# Patient Record
Sex: Male | Born: 1955
Health system: Southern US, Community
[De-identification: ages and names within clinical notes are randomized; demographics above are authoritative.]

## PROBLEM LIST (undated history)

## (undated) DIAGNOSIS — E7800 Pure hypercholesterolemia, unspecified: Secondary | ICD-10-CM

---

## 2002-03-20 ENCOUNTER — Emergency Department (HOSPITAL_COMMUNITY): Admission: EM | Admit: 2002-03-20 | Discharge: 2002-03-20 | Payer: Self-pay | Admitting: Emergency Medicine

## 2002-03-20 ENCOUNTER — Encounter: Payer: Self-pay | Admitting: Emergency Medicine

## 2002-06-06 ENCOUNTER — Ambulatory Visit (HOSPITAL_COMMUNITY): Admission: RE | Admit: 2002-06-06 | Discharge: 2002-06-06 | Payer: Self-pay | Admitting: Internal Medicine

## 2002-06-06 ENCOUNTER — Encounter: Payer: Self-pay | Admitting: Internal Medicine

## 2005-11-30 ENCOUNTER — Ambulatory Visit: Payer: Self-pay | Admitting: Family Medicine

## 2008-06-22 ENCOUNTER — Emergency Department (HOSPITAL_COMMUNITY): Admission: EM | Admit: 2008-06-22 | Discharge: 2008-06-22 | Payer: Self-pay | Admitting: Emergency Medicine

## 2008-06-25 ENCOUNTER — Emergency Department (HOSPITAL_COMMUNITY): Admission: EM | Admit: 2008-06-25 | Discharge: 2008-06-25 | Payer: Self-pay | Admitting: Family Medicine

## 2009-08-18 ENCOUNTER — Emergency Department (HOSPITAL_COMMUNITY): Admission: EM | Admit: 2009-08-18 | Discharge: 2009-08-18 | Payer: Self-pay | Admitting: Emergency Medicine

## 2011-09-05 ENCOUNTER — Ambulatory Visit (INDEPENDENT_AMBULATORY_CARE_PROVIDER_SITE_OTHER): Payer: BC Managed Care – PPO

## 2011-09-05 ENCOUNTER — Inpatient Hospital Stay (INDEPENDENT_AMBULATORY_CARE_PROVIDER_SITE_OTHER)
Admission: RE | Admit: 2011-09-05 | Discharge: 2011-09-05 | Disposition: A | Discharge status: Home / Self Care | Payer: BC Managed Care – PPO | Source: Ambulatory Visit | Attending: Emergency Medicine | Admitting: Emergency Medicine

## 2011-09-05 DIAGNOSIS — K2990 Gastroduodenitis, unspecified, without bleeding: Secondary | ICD-10-CM

## 2011-09-05 DIAGNOSIS — A048 Other specified bacterial intestinal infections: Secondary | ICD-10-CM

## 2011-09-05 DIAGNOSIS — K2970 Gastritis, unspecified, without bleeding: Secondary | ICD-10-CM

## 2011-09-05 LAB — POCT H PYLORI SCREEN: H. PYLORI SCREEN, POC: POSITIVE — AB

## 2011-09-05 LAB — POCT I-STAT, CHEM 8
Chloride: 103 mEq/L (ref 96–112)
HCT: 46 % (ref 39.0–52.0)
Hemoglobin: 15.6 g/dL (ref 13.0–17.0)
Potassium: 3.7 mEq/L (ref 3.5–5.1)
Sodium: 139 mEq/L (ref 135–145)

## 2013-07-23 ENCOUNTER — Encounter (HOSPITAL_COMMUNITY): Payer: Self-pay | Admitting: Emergency Medicine

## 2013-07-23 ENCOUNTER — Emergency Department (HOSPITAL_COMMUNITY)
Admission: EM | Admit: 2013-07-23 | Discharge: 2013-07-23 | Disposition: A | Discharge status: Home / Self Care | Payer: BC Managed Care – PPO | Attending: Emergency Medicine | Admitting: Emergency Medicine

## 2013-07-23 DIAGNOSIS — K2970 Gastritis, unspecified, without bleeding: Secondary | ICD-10-CM | POA: Insufficient documentation

## 2013-07-23 DIAGNOSIS — K2990 Gastroduodenitis, unspecified, without bleeding: Secondary | ICD-10-CM | POA: Insufficient documentation

## 2013-07-23 DIAGNOSIS — F17200 Nicotine dependence, unspecified, uncomplicated: Secondary | ICD-10-CM | POA: Insufficient documentation

## 2013-07-23 DIAGNOSIS — R1013 Epigastric pain: Secondary | ICD-10-CM

## 2013-07-23 LAB — CBC WITH DIFFERENTIAL/PLATELET
Eosinophils Absolute: 0.1 10*3/uL (ref 0.0–0.7)
Hemoglobin: 14.7 g/dL (ref 13.0–17.0)
Lymphocytes Relative: 19 % (ref 12–46)
Lymphs Abs: 1.8 10*3/uL (ref 0.7–4.0)
MCH: 29.5 pg (ref 26.0–34.0)
Monocytes Relative: 6 % (ref 3–12)
Neutro Abs: 7.3 10*3/uL (ref 1.7–7.7)
Neutrophils Relative %: 74 % (ref 43–77)
Platelets: 244 10*3/uL (ref 150–400)
RBC: 4.99 MIL/uL (ref 4.22–5.81)
WBC: 9.8 10*3/uL (ref 4.0–10.5)

## 2013-07-23 LAB — COMPREHENSIVE METABOLIC PANEL
ALT: 18 U/L (ref 0–53)
Alkaline Phosphatase: 83 U/L (ref 39–117)
Chloride: 100 mEq/L (ref 96–112)
GFR calc Af Amer: >90 mL/min (ref >90)
Glucose, Bld: 104 mg/dL — ABNORMAL HIGH (ref 70–99)
Potassium: 3.7 mEq/L (ref 3.5–5.1)
Sodium: 137 mEq/L (ref 135–145)
Total Bilirubin: 0.3 mg/dL (ref 0.3–1.2)
Total Protein: 7.6 g/dL (ref 6.0–8.3)

## 2013-07-23 LAB — URINE MICROSCOPIC-ADD ON

## 2013-07-23 LAB — URINALYSIS, ROUTINE W REFLEX MICROSCOPIC
Bilirubin Urine: NEGATIVE
Glucose, UA: NEGATIVE mg/dL
Protein, ur: NEGATIVE mg/dL
Specific Gravity, Urine: 1.006 (ref 1.005–1.030)

## 2013-07-23 MED ORDER — GI COCKTAIL ~~LOC~~
30.0000 mL | Freq: Once | ORAL | Status: AC
  Administered 2013-07-23: 30 mL via ORAL
  Filled 2013-07-23: qty 30

## 2013-07-23 MED ORDER — FENTANYL CITRATE 0.05 MG/ML IJ SOLN
50.0000 ug | Freq: Once | INTRAMUSCULAR | Status: AC
  Administered 2013-07-23: 50 ug via INTRAVENOUS
  Filled 2013-07-23: qty 2

## 2013-07-23 MED ORDER — PANTOPRAZOLE SODIUM 20 MG PO TBEC: 40.0000 mg | DELAYED_RELEASE_TABLET | Freq: Every day | ORAL | Status: DC

## 2013-07-23 MED ORDER — PANTOPRAZOLE SODIUM 40 MG IV SOLR
40.0000 mg | Freq: Once | INTRAVENOUS | Status: AC
  Administered 2013-07-23: 40 mg via INTRAVENOUS
  Filled 2013-07-23: qty 40

## 2013-07-23 MED ORDER — ONDANSETRON HCL 4 MG/2ML IJ SOLN
4.0000 mg | Freq: Once | INTRAMUSCULAR | Status: AC
  Administered 2013-07-23: 4 mg via INTRAVENOUS
  Filled 2013-07-23: qty 2

## 2013-07-23 MED ORDER — SUCRALFATE 1 G PO TABS: 1.0000 g | ORAL_TABLET | Freq: Four times a day (QID) | ORAL | Status: AC

## 2013-07-23 NOTE — ED Notes (Signed)
Iv started and med given 

## 2013-07-23 NOTE — ED Provider Notes (Signed)
CSN: [id number]     Arrival date & time 07/23/13  0021 History     First MD Initiated Contact with Patient 07/23/13 0105     Chief Complaint  Patient presents with  . Abdominal Pain   (Consider location/radiation/quality/duration/timing/severity/associated sxs/prior Treatment) HPI 57 yo male presents to the ER from home with complaint of upper abdominal pain for the last 3 weeks.  Pain is described as "strong".  No vomiting, no fever.  Pain usually wakes him from sleep around 3-4 am.  He has been taking milk and baking soda with some relief until today.  Pt drinks about a case of beer over two days every weekend.  He has had 4 beers today.  No prior surgeries.  Has not tried OTC meds.    History reviewed. No pertinent past medical history. History reviewed. No pertinent past surgical history. No family history on file. History  Substance Use Topics  . Smoking status: Current Every Day Smoker  . Smokeless tobacco: Not on file  . Alcohol Use: Yes    Review of Systems  All other systems reviewed and are negative.    Allergies  Review of patient's allergies indicates no known allergies.  Home Medications  No current outpatient prescriptions on file. BP 140/105  Pulse 88  Temp(Src) 98.1 F (36.7 C) (Oral)  Resp 20  SpO2 97% Physical Exam  Nursing note and vitals reviewed. Constitutional: He is oriented to person, place, and time. He appears well-developed and well-nourished.  HENT:  Head: Normocephalic and atraumatic.  Nose: Nose normal.  Mouth/Throat: Oropharynx is clear and moist.  Eyes: Conjunctivae and EOM are normal. Pupils are equal, round, and reactive to light.  Neck: Normal range of motion. Neck supple. No JVD present. No tracheal deviation present. No thyromegaly present.  Cardiovascular: Normal rate, regular rhythm, normal heart sounds and intact distal pulses.  Exam reveals no gallop and no friction rub.   No murmur heard. Pulmonary/Chest: Effort normal and  breath sounds normal. No stridor. No respiratory distress. He has no wheezes. He has no rales. He exhibits no tenderness.  Abdominal: Soft. Bowel sounds are normal. He exhibits no distension and no mass. There is tenderness (epigastric tenderness). There is no rebound and no guarding.  Musculoskeletal: Normal range of motion. He exhibits no edema and no tenderness.  Lymphadenopathy:    He has no cervical adenopathy.  Neurological: He is alert and oriented to person, place, and time. A cranial nerve deficit is present. He exhibits normal muscle tone. Coordination normal.  Skin: Skin is dry. No rash noted. No erythema. No pallor.  Psychiatric: He has a normal mood and affect. His behavior is normal. Judgment and thought content normal.    ED Course   Procedures (including critical care time)  Labs Reviewed  COMPREHENSIVE METABOLIC PANEL - Abnormal; Notable for the following:    Glucose, Bld 104 (*)    GFR calc non Af Amer 89 (*)    All other components within normal limits  ETHANOL - Abnormal; Notable for the following:    Alcohol, Ethyl (B) 136 (*)    All other components within normal limits  CBC WITH DIFFERENTIAL  LIPASE, BLOOD  URINALYSIS, ROUTINE W REFLEX MICROSCOPIC   No results found. 1. Epigastric pain   2. Gastritis     MDM  57 yo male with upper abdominal pain.  No signs of pancreatitis based on lipase.  No vomiting.  Possible gastritis/peptic ulcer.  Will start on protonix, carafate.  Pt  instructed to stop drinking, will need PCM and f/u with GI.  Olivia Mackie, MD 07/23/13 [phone]

## 2013-07-23 NOTE — ED Notes (Signed)
The pt reports that his pain  Is gone now

## 2013-07-23 NOTE — ED Notes (Signed)
The pt has had mid abd pain for 3 weeks with diarrhea.  He drank 4 beers today odor of alcohol on his breath.  He denies drinking every day

## 2013-07-23 NOTE — ED Notes (Signed)
PT. REPORTS MID ABDOMINAL PAIN WITH SLIGHT NAUSEA FOR 3 WEEKS , 3 BEERS THIS EVENING , DENIES EMESIS OR DIARRHEA. RESPIRATIONS UNLABORED .

## 2015-03-22 ENCOUNTER — Encounter (HOSPITAL_COMMUNITY): Payer: Self-pay | Admitting: *Deleted

## 2015-03-22 DIAGNOSIS — Z79899 Other long term (current) drug therapy: Secondary | ICD-10-CM | POA: Diagnosis not present

## 2015-03-22 DIAGNOSIS — Z720 Tobacco use: Secondary | ICD-10-CM | POA: Diagnosis not present

## 2015-03-22 DIAGNOSIS — M549 Dorsalgia, unspecified: Secondary | ICD-10-CM | POA: Diagnosis not present

## 2015-03-22 DIAGNOSIS — K2970 Gastritis, unspecified, without bleeding: Secondary | ICD-10-CM | POA: Insufficient documentation

## 2015-03-22 DIAGNOSIS — R1013 Epigastric pain: Secondary | ICD-10-CM | POA: Diagnosis present

## 2015-03-22 NOTE — ED Notes (Signed)
Called pt twice for triage  with no answer  

## 2015-03-22 NOTE — ED Notes (Signed)
abd pain for 2 weeks he vomited today.  He has been drinking  Alcohol for the opast several days

## 2015-03-23 ENCOUNTER — Emergency Department (HOSPITAL_COMMUNITY)
Admission: EM | Admit: 2015-03-23 | Discharge: 2015-03-23 | Disposition: A | Discharge status: Home / Self Care | Payer: BLUE CROSS/BLUE SHIELD | Attending: Emergency Medicine | Admitting: Emergency Medicine

## 2015-03-23 DIAGNOSIS — K2970 Gastritis, unspecified, without bleeding: Secondary | ICD-10-CM

## 2015-03-23 LAB — URINALYSIS, ROUTINE W REFLEX MICROSCOPIC
BILIRUBIN URINE: NEGATIVE
GLUCOSE, UA: NEGATIVE mg/dL
Hgb urine dipstick: NEGATIVE
KETONES UR: NEGATIVE mg/dL
LEUKOCYTES UA: NEGATIVE
NITRITE: NEGATIVE
PH: 5 (ref 5.0–8.0)
PROTEIN: NEGATIVE mg/dL
Specific Gravity, Urine: 1.011 (ref 1.005–1.030)
Urobilinogen, UA: 0.2 mg/dL (ref 0.0–1.0)

## 2015-03-23 LAB — CBC WITH DIFFERENTIAL/PLATELET
BASOS ABS: 0 10*3/uL (ref 0.0–0.1)
BASOS PCT: 0 % (ref 0–1)
EOS PCT: 1 % (ref 0–5)
Eosinophils Absolute: 0.1 10*3/uL (ref 0.0–0.7)
HEMATOCRIT: 43 % (ref 39.0–52.0)
Hemoglobin: 14.5 g/dL (ref 13.0–17.0)
LYMPHS ABS: 2.4 10*3/uL (ref 0.7–4.0)
Lymphocytes Relative: 33 % (ref 12–46)
MCH: 28.1 pg (ref 26.0–34.0)
MCHC: 33.7 g/dL (ref 30.0–36.0)
MCV: 83.3 fL (ref 78.0–100.0)
MONO ABS: 0.5 10*3/uL (ref 0.1–1.0)
Monocytes Relative: 7 % (ref 3–12)
NEUTROS ABS: 4.2 10*3/uL (ref 1.7–7.7)
Neutrophils Relative %: 59 % (ref 43–77)
Platelets: 296 10*3/uL (ref 150–400)
RBC: 5.16 MIL/uL (ref 4.22–5.81)
RDW: 15.4 % (ref 11.5–15.5)
WBC: 7.3 10*3/uL (ref 4.0–10.5)

## 2015-03-23 LAB — COMPREHENSIVE METABOLIC PANEL
ALT: 22 U/L (ref 0–53)
ANION GAP: 5 (ref 5–15)
AST: 33 U/L (ref 0–37)
Albumin: 4.5 g/dL (ref 3.5–5.2)
Alkaline Phosphatase: 79 U/L (ref 39–117)
BILIRUBIN TOTAL: 0.6 mg/dL (ref 0.3–1.2)
BUN: 6 mg/dL (ref 6–23)
CALCIUM: 9.2 mg/dL (ref 8.4–10.5)
CO2: 32 mmol/L (ref 19–32)
Chloride: 105 mmol/L (ref 96–112)
Creatinine, Ser: 0.99 mg/dL (ref 0.50–1.35)
GFR, EST NON AFRICAN AMERICAN: 88 mL/min — AB (ref >90)
Glucose, Bld: 95 mg/dL (ref 70–99)
Potassium: 3.9 mmol/L (ref 3.5–5.1)
SODIUM: 142 mmol/L (ref 135–145)
Total Protein: 7.9 g/dL (ref 6.0–8.3)

## 2015-03-23 LAB — LIPASE, BLOOD: Lipase: 24 U/L (ref 11–59)

## 2015-03-23 MED ORDER — PANTOPRAZOLE SODIUM 40 MG PO TBEC
40.0000 mg | DELAYED_RELEASE_TABLET | Freq: Once | ORAL | Status: AC
  Administered 2015-03-23: 40 mg via ORAL
  Filled 2015-03-23: qty 1

## 2015-03-23 MED ORDER — GI COCKTAIL ~~LOC~~
30.0000 mL | Freq: Once | ORAL | Status: AC
  Administered 2015-03-23: 30 mL via ORAL
  Filled 2015-03-23: qty 30

## 2015-03-23 MED ORDER — ONDANSETRON 4 MG PO TBDP: ORAL_TABLET | ORAL | Status: DC

## 2015-03-23 MED ORDER — ONDANSETRON 4 MG PO TBDP
4.0000 mg | ORAL_TABLET | Freq: Once | ORAL | Status: AC
Start: 2015-03-23 — End: 2015-03-23
  Administered 2015-03-23: 4 mg via ORAL
  Filled 2015-03-23: qty 1

## 2015-03-23 MED ORDER — PANTOPRAZOLE SODIUM 40 MG PO TBEC: 40.0000 mg | DELAYED_RELEASE_TABLET | Freq: Every day | ORAL | Status: DC

## 2015-03-23 NOTE — Discharge Instructions (Signed)

## 2015-03-23 NOTE — ED Provider Notes (Signed)
CSN: [id number]     Arrival date & time 03/22/15  2251 History  This chart was scribed for Loren Raceravid Keiasia Christianson, MD by Tanda RockersMargaux Venter, ED Scribe. This patient was seen in room D33C/D33C and the patient's care was started at 12:51 AM.    Chief Complaint  Patient presents with  . Abdominal Pain   The history is provided by the patient. No language interpreter was used.     HPI Comments: Justin SallesKermit Fritz is a 59 y.o. male who presents to the Emergency Department complaining of constant abdominal pain that began approximately 2 weeks ago. He reports that the pain has gradually been worsening, causing him to come to the ED. Pt mentions that he vomited once yesterday but has not vomited any other time. He admits to eating a lot of spicy food. Pt also admits to drinking 7 beers earlier today. Pt took [employer] Monday, 3/21 and did not drink EtOH, but states that it did not relieve his symptoms. He does mention that he has chronic back pain and took the [employer] [place] powder for the back pain as well with no relief. He denies diarrhea, hematochezia, hematemesis, or any other symptoms.    History reviewed. No pertinent past medical history. History reviewed. No pertinent past surgical history. No family history on file. History  Substance Use Topics  . Smoking status: Current Every Day Smoker  . Smokeless tobacco: Not on file  . Alcohol Use: Yes    Review of Systems  Constitutional: Negative for fever and chills.  Respiratory: Negative for cough and shortness of breath.   Cardiovascular: Negative for chest pain and leg swelling.  Gastrointestinal: Positive for nausea, vomiting and abdominal pain. Negative for diarrhea, constipation, blood in stool and anal bleeding.  Genitourinary: Negative for dysuria and hematuria.  Musculoskeletal: Positive for back pain. Negative for neck pain and neck stiffness.  Skin: Negative for rash and wound.  Neurological: Negative for dizziness, weakness, light-headedness,  numbness and headaches.  All other systems reviewed and are negative.     Allergies  Review of patient's allergies indicates no known allergies.  Home Medications   Prior to Admission medications   Medication Sig Start Date End Date Taking? Authorizing Provider  Aspirin-Salicylamide-Caffeine (BC HEADACHE PO) Take 1 packet by mouth daily as needed (spinal back pain).   Yes Historical Provider, MD  CALCIUM PO Take 1 tablet by mouth daily.    Historical Provider, MD  ondansetron (ZOFRAN ODT) 4 MG disintegrating tablet 4mg  ODT q4 hours prn nausea/vomit 03/23/15   Loren Raceravid Woodley Petzold, MD  pantoprazole (PROTONIX) 40 MG tablet Take 1 tablet (40 mg total) by mouth daily. 03/23/15   Loren Raceravid Fay Swider, MD  sucralfate (CARAFATE) 1 G tablet Take 1 tablet (1 g total) by mouth 4 (four) times daily. Patient not taking: Reported on 03/23/2015 07/23/13   Marisa Severinlga Otter, MD   Triage Vitals: BP 118/83 mmHg  Pulse 83  Temp(Src) 98.3 F (36.8 C) (Oral)  Resp 18  Ht 5\' 9"  (1.753 m)  Wt 180 lb (81.647 kg)  BMI 26.57 kg/m2  SpO2 96%   Physical Exam  Constitutional: He is oriented to person, place, and time. He appears well-developed and well-nourished. No distress.  HENT:  Head: Normocephalic and atraumatic.  Mouth/Throat: Oropharynx is clear and moist.  Eyes: EOM are normal. Pupils are equal, round, and reactive to light.  Neck: Normal range of motion. Neck supple.  Cardiovascular: Normal rate and regular rhythm.   Pulmonary/Chest: Effort normal and breath sounds normal. No respiratory  distress. He has no wheezes. He has no rales.  Abdominal: Soft. Bowel sounds are normal. He exhibits no distension and no mass. There is tenderness (diffusely tender to palpation but epecially so in the epigastric and left upper quadrants). There is no rebound and no guarding.  Musculoskeletal: Normal range of motion. He exhibits no edema or tenderness.  No CVA tenderness bilaterally.  Neurological: He is alert and oriented to  person, place, and time.  Moves all extremities without deficit. Sensation grossly intact.  Skin: Skin is warm and dry. No rash noted. No erythema.  Psychiatric: He has a normal mood and affect. His behavior is normal.  Nursing note and vitals reviewed.   ED Course  Procedures (including critical care time)  DIAGNOSTIC STUDIES: Oxygen Saturation is 96% on RA, normal by my interpretation.    COORDINATION OF CARE: 12:57 AM-Discussed treatment plan which includes Ethanol, CBC, CMP, Lipase, UA with pt at bedside and pt agreed to plan.   Labs Review Labs Reviewed  COMPREHENSIVE METABOLIC PANEL - Abnormal; Notable for the following:    GFR calc non Af Amer 88 (*)    All other components within normal limits  CBC WITH DIFFERENTIAL/PLATELET  LIPASE, BLOOD  URINALYSIS, ROUTINE W REFLEX MICROSCOPIC  ETHANOL    Imaging Review No results found.   EKG Interpretation None      MDM   Final diagnoses:  Gastritis    I personally performed the services described in this documentation, which was scribed in my presence. The recorded information has been reviewed and is accurate.    Patient is resting well. Improved symptoms after GI cocktail. Likely has gastritis due to diet, alcohol intake. Have advised dietary changes. He's been told to avoid NSAIDs. We'll start back on PPI and advise follow-up with gastroenterologist. Return precautions given.  Loren Racer, MD 03/23/15 0430

## 2015-03-23 NOTE — ED Notes (Signed)
Pt sts he has had a "gnawing" pain in his left side for 2 weeks.  Pt sts he was lifting cinder blocks and heavy piles of wood and the pain began.  Pt sts he is having normal bowel movements.  Denies any nausea and vomiting, except one episode "[place] [employer]".    Pt sts he has had 7 beers today.  Speech is a little slurred in room with RN.

## 2019-11-12 ENCOUNTER — Encounter (HOSPITAL_COMMUNITY): Payer: Self-pay

## 2019-11-12 ENCOUNTER — Emergency Department (HOSPITAL_COMMUNITY)
Admission: EM | Admit: 2019-11-12 | Discharge: 2019-11-12 | Disposition: A | Discharge status: Home / Self Care | Payer: BLUE CROSS/BLUE SHIELD | Attending: Emergency Medicine | Admitting: Emergency Medicine

## 2019-11-12 DIAGNOSIS — L03115 Cellulitis of right lower limb: Secondary | ICD-10-CM | POA: Insufficient documentation

## 2019-11-12 DIAGNOSIS — F17210 Nicotine dependence, cigarettes, uncomplicated: Secondary | ICD-10-CM | POA: Insufficient documentation

## 2019-11-12 DIAGNOSIS — L03119 Cellulitis of unspecified part of limb: Secondary | ICD-10-CM

## 2019-11-12 DIAGNOSIS — Z79899 Other long term (current) drug therapy: Secondary | ICD-10-CM | POA: Insufficient documentation

## 2019-11-12 MED ORDER — TRAMADOL HCL 50 MG PO TABS: 50.0000 mg | ORAL_TABLET | Freq: Four times a day (QID) | ORAL | 0 refills | Status: DC | PRN

## 2019-11-12 MED ORDER — DOXYCYCLINE HYCLATE 100 MG PO CAPS: 100.0000 mg | ORAL_CAPSULE | Freq: Two times a day (BID) | ORAL | 0 refills | Status: DC

## 2019-11-12 NOTE — Discharge Instructions (Addendum)
Use warm compresses on your leg.  Return here as needed for any worsening in your condition.

## 2019-11-12 NOTE — ED Triage Notes (Signed)
Patient complains of right lower leg wound, states that he thinks he was bitten by spider 1 wek ago, no fever, no drainage

## 2019-11-12 NOTE — ED Provider Notes (Signed)
[facility] EMERGENCY DEPARTMENT Provider Note   CSN: [id number] Arrival date & time: 11/12/19  1203     History   Chief Complaint No chief complaint on file.   HPI Justin Fritz is a 63 y.o. male.     HPI Patient presents to the emergency department with a swollen reddened area in the right lower leg on the shin.  The patient states he is noticed this over the last 4 days.  Patient was concerned that something may have bit him.  Patient states that there is been no drainage from the site.  He states that it is painful and does have swelling around it.  He states there is also redness around the area as well.  Patient denies fever, nausea, vomiting, weakness, dizziness, headache, blurred vision or syncope. History reviewed. No pertinent past medical history.  There are no active problems to display for this patient.   History reviewed. No pertinent surgical history.      Home Medications    Prior to Admission medications   Medication Sig Start Date End Date Taking? Authorizing Provider  Aspirin-Salicylamide-Caffeine (BC HEADACHE PO) Take 1 packet by mouth daily as needed (spinal back pain).    [provider]  CALCIUM PO Take 1 tablet by mouth daily.    [provider]  ondansetron (ZOFRAN ODT) 4 MG disintegrating tablet 4mg  ODT q4 hours prn nausea/vomit 03/23/15   03/25/15, MD  pantoprazole (PROTONIX) 40 MG tablet Take 1 tablet (40 mg total) by mouth daily. 03/23/15   03/25/15, MD  sucralfate (CARAFATE) 1 G tablet Take 1 tablet (1 g total) by mouth 4 (four) times daily. Patient not taking: Reported on 03/23/2015 07/23/13   07/25/13, MD    Family History No family history on file.  Social History Social History   Tobacco Use  . Smoking status: Current Every Day Smoker  Substance Use Topics  . Alcohol use: Yes  . Drug use: No     Allergies   Patient has no known allergies.   Review of Systems Review of  Systems  All other systems negative except as documented in the HPI. All pertinent positives and negatives as reviewed in the HPI. Physical Exam Updated Vital Signs BP (!) 143/85   Pulse 75   Temp 98.4 F (36.9 C) (Oral)   Resp 18   SpO2 100%   Physical Exam Vitals signs and nursing note reviewed.  Constitutional:      General: He is not in acute distress.    Appearance: He is well-developed.  HENT:     Head: Normocephalic and atraumatic.  Eyes:     Pupils: Pupils are equal, round, and reactive to light.  Pulmonary:     Effort: Pulmonary effort is normal.  Musculoskeletal:       Legs:  Skin:    General: Skin is warm and dry.  Neurological:     Mental Status: He is alert and oriented to person, place, and time.      ED Treatments / Results  Labs (all labs ordered are listed, but only abnormal results are displayed) Labs Reviewed - No data to display  EKG None  Radiology No results found.  Procedures Procedures (including critical care time)  Medications Ordered in ED Medications - No data to display   Initial Impression / Assessment and Plan / ED Course  I have reviewed the triage vital signs and the nursing notes.  Pertinent labs & imaging results that  were available during my care of the patient were reviewed by me and considered in my medical decision making (see chart for details).       Patient is advised to use heat over the area.  also advised him to take the full course of antibiotics.  I feel the patient has a cellulitis that is causing the area of irritation.  Have advised patient to return here for any worsening in his condition.  Final Clinical Impressions(s) / ED Diagnoses   Final diagnoses:  None    ED Discharge Orders    None       Dalia Heading, PA-C 11/12/19 1555    Lucrezia Starch, MD 11/14/19 [phone]

## 2019-12-23 ENCOUNTER — Emergency Department (HOSPITAL_COMMUNITY)
Admission: EM | Admit: 2019-12-23 | Discharge: 2019-12-25 | Discharge status: Against Medical Advice | Payer: Self-pay | Attending: Emergency Medicine | Admitting: Emergency Medicine

## 2019-12-23 ENCOUNTER — Encounter (HOSPITAL_COMMUNITY): Payer: Self-pay | Admitting: Emergency Medicine

## 2019-12-23 ENCOUNTER — Other Ambulatory Visit: Payer: Self-pay

## 2019-12-23 DIAGNOSIS — Z5321 Procedure and treatment not carried out due to patient leaving prior to being seen by health care provider: Secondary | ICD-10-CM

## 2019-12-23 DIAGNOSIS — Z5320 Procedure and treatment not carried out because of patient's decision for unspecified reasons: Secondary | ICD-10-CM | POA: Insufficient documentation

## 2019-12-23 DIAGNOSIS — L02211 Cutaneous abscess of abdominal wall: Secondary | ICD-10-CM | POA: Insufficient documentation

## 2019-12-23 LAB — COMPREHENSIVE METABOLIC PANEL
ALT: 26 U/L (ref 0–44)
AST: 27 U/L (ref 15–41)
Albumin: 3.7 g/dL (ref 3.5–5.0)
Alkaline Phosphatase: 96 U/L (ref 38–126)
Anion gap: 9 (ref 5–15)
BUN: 9 mg/dL (ref 8–23)
CO2: 26 mmol/L (ref 22–32)
Calcium: 8.8 mg/dL — ABNORMAL LOW (ref 8.9–10.3)
Chloride: 105 mmol/L (ref 98–111)
Creatinine, Ser: 1.02 mg/dL (ref 0.61–1.24)
GFR calc Af Amer: >60 mL/min (ref >60)
GFR calc non Af Amer: >60 mL/min (ref >60)
Glucose, Bld: 111 mg/dL — ABNORMAL HIGH (ref 70–99)
Potassium: 4 mmol/L (ref 3.5–5.1)
Sodium: 140 mmol/L (ref 135–145)
Total Bilirubin: 0.5 mg/dL (ref 0.3–1.2)
Total Protein: 6.8 g/dL (ref 6.5–8.1)

## 2019-12-23 LAB — CBC WITH DIFFERENTIAL/PLATELET
Abs Immature Granulocytes: 0.01 10*3/uL (ref 0.00–0.07)
Basophils Absolute: 0 10*3/uL (ref 0.0–0.1)
Basophils Relative: 0 %
Eosinophils Absolute: 0.1 10*3/uL (ref 0.0–0.5)
Eosinophils Relative: 1 %
HCT: 43.6 % (ref 39.0–52.0)
Hemoglobin: 14 g/dL (ref 13.0–17.0)
Immature Granulocytes: 0 %
Lymphocytes Relative: 20 %
Lymphs Abs: 0.8 10*3/uL (ref 0.7–4.0)
MCH: 26.8 pg (ref 26.0–34.0)
MCHC: 32.1 g/dL (ref 30.0–36.0)
MCV: 83.5 fL (ref 80.0–100.0)
Monocytes Absolute: 0.4 10*3/uL (ref 0.1–1.0)
Monocytes Relative: 9 %
Neutro Abs: 3 10*3/uL (ref 1.7–7.7)
Neutrophils Relative %: 70 %
Platelets: 237 10*3/uL (ref 150–400)
RBC: 5.22 MIL/uL (ref 4.22–5.81)
RDW: 15.1 % (ref 11.5–15.5)
WBC: 4.3 10*3/uL (ref 4.0–10.5)
nRBC: 0 % (ref 0.0–0.2)

## 2019-12-23 LAB — LACTIC ACID, PLASMA: Lactic Acid, Venous: 1.5 mmol/L (ref 0.5–1.9)

## 2019-12-23 MED ORDER — LIDOCAINE-PRILOCAINE 2.5-2.5 % EX CREA
TOPICAL_CREAM | Freq: Once | CUTANEOUS | Status: AC
  Filled 2019-12-23 (×2): qty 5

## 2019-12-23 NOTE — ED Notes (Signed)
Patient stated he is unable to stay d/t bus transportation. Attempted to re-direct patient to stay, patient refused.

## 2019-12-23 NOTE — ED Provider Notes (Signed)
[facility] EMERGENCY DEPARTMENT Provider Note   CSN: [id number] Arrival date & time: 12/23/19  1318     History Chief Complaint  Patient presents with  . Abscess    Justin Fritz is a 63 y.o. male with no known significant past medical history presents today for evaluation of an abscess.  He reports that over the past 5 days he has had worsening redness and swelling on the left anterior abdominal wall.  He denies any fevers or chills.  No nausea, vomiting, or diarrhea.  He states that he had a skin infection on his leg in November that was treated with p.o. antibiotics.  He has not had a abscess on his abdomen before.  He states he does not give himself any injections.  HPI     History reviewed. No pertinent past medical history.  There are no problems to display for this patient.   History reviewed. No pertinent surgical history.     No family history on file.  Social History   Tobacco Use  . Smoking status: Current Every Day Smoker  . Smokeless tobacco: Never Used  Substance Use Topics  . Alcohol use: Yes  . Drug use: No    Home Medications Prior to Admission medications   Medication Sig Start Date End Date Taking? Authorizing Provider  Aspirin-Salicylamide-Caffeine (BC HEADACHE PO) Take 1 packet by mouth daily as needed (spinal back pain).    [provider]  CALCIUM PO Take 1 tablet by mouth daily.    [provider]  doxycycline (VIBRAMYCIN) 100 MG capsule Take 1 capsule (100 mg total) by mouth 2 (two) times daily. 11/12/19   Lawyer, Cristal Deer, PA-C  ondansetron (ZOFRAN ODT) 4 MG disintegrating tablet 4mg  ODT q4 hours prn nausea/vomit 03/23/15   03/25/15, MD  pantoprazole (PROTONIX) 40 MG tablet Take 1 tablet (40 mg total) by mouth daily. 03/23/15   03/25/15, MD  sucralfate (CARAFATE) 1 G tablet Take 1 tablet (1 g total) by mouth 4 (four) times daily. Patient not taking: Reported on 03/23/2015 07/23/13    07/25/13, MD  traMADol (ULTRAM) 50 MG tablet Take 1 tablet (50 mg total) by mouth every 6 (six) hours as needed for severe pain. 11/12/19   11/14/19, PA-C    Allergies    Patient has no known allergies.  Review of Systems   Review of Systems  Constitutional: Negative for chills and fever.  HENT: Negative for congestion.   Respiratory: Negative for chest tightness and shortness of breath.   Gastrointestinal: Negative for abdominal pain, nausea and vomiting.  Skin: Positive for color change and wound.  All other systems reviewed and are negative.   Physical Exam Updated Vital Signs BP 117/83 (BP Location: Right Arm)   Pulse 67   Temp 98.3 F (36.8 C) (Oral)   Resp 14   SpO2 99%   Physical Exam Vitals and nursing note reviewed.  Constitutional:      General: He is not in acute distress.    Appearance: He is well-developed. He is not diaphoretic.  HENT:     Head: Normocephalic and atraumatic.  Eyes:     General: No scleral icterus.       Right eye: No discharge.        Left eye: No discharge.     Conjunctiva/sclera: Conjunctivae normal.  Cardiovascular:     Rate and Rhythm: Normal rate and regular rhythm.  Pulmonary:     Effort: Pulmonary effort is normal.  No respiratory distress.     Breath sounds: No stridor.  Abdominal:     General: There is no distension.     Tenderness: There is no abdominal tenderness (Tender over area of cellulitis however otherwise abdomen is nontender).  Musculoskeletal:        General: No deformity.     Cervical back: Normal range of motion.  Skin:    General: Skin is warm and dry.     Comments: There is an approximately 5 cm x 10 cm area of erythema on the left anterior abdominal wall with a centralized 2 cm x 2 cm area of induration with a subcentimeter pustule present.  Neurological:     Mental Status: He is alert.     Motor: No abnormal muscle tone.  Psychiatric:        Behavior: Behavior normal.     ED Results /  Procedures / Treatments   Labs (all labs ordered are listed, but only abnormal results are displayed) Labs Reviewed  COMPREHENSIVE METABOLIC PANEL - Abnormal; Notable for the following components:      Result Value   Glucose, Bld 111 (*)    Calcium 8.8 (*)    All other components within normal limits  LACTIC ACID, PLASMA  CBC WITH DIFFERENTIAL/PLATELET  LACTIC ACID, PLASMA    EKG None  Radiology No results found.  Procedures Ultrasound ED Soft Tissue  Date/Time: 12/23/2019 5:13 PM Performed by: Lorin Glass, PA-C Authorized by: Lorin Glass, PA-C   Procedure details:    Indications: localization of abscess     Transverse view:  Visualized   Longitudinal view:  Visualized   Images: archived   Location:    Location: abdominal wall     Side:  Left Findings:     abscess present    cellulitis present    no foreign body present Comments:     Abscess does not appear to clearly track into the abdominal cavity.    (including critical care time)  Medications Ordered in ED Medications  lidocaine-prilocaine (EMLA) cream ( Topical Given by Other 12/23/19 1848)    ED Course  I have reviewed the triage vital signs and the nursing notes.  Pertinent labs & imaging results that were available during my care of the patient were reviewed by me and considered in my medical decision making (see chart for details).  Clinical Course as of Dec 22 2009  Sat Dec 23, 2019  2004 Went to drain abscess after letting EMLA sit and patient had left.  He left while I was providing care to another patient and reportedly refused to wait for me to discuss risks of this choice.     [EH]    Clinical Course User Index [EH] Ollen Gross   MDM Rules/Calculators/A&P                     Patient presents today for evaluation of a abscess on his left abdominal wound.  He denies any systemic symptoms.  Here he is afebrile, not tachycardic or tachypneic and in no obvious  distress.  Ultrasound was used to localize abscess.  No evidence of hernia or abscess extending intra-abdominally.  Patient was very jumpy when being examined, and I was concerned that he would not tolerate lidocaine infiltration in a safe manner, therefore EMLA cream was applied.  While EMLA cream was being allowed to work patient decided to leave.  He did not give adequate notice for me  to be able to speak with him and discuss risks of this decision.   Final Clinical Impression(s) / ED Diagnoses Final diagnoses:  Abdominal wall abscess    Rx / DC Orders ED Discharge Orders    None       Cristina GongHammond, Arnett Duddy W, PA-C 12/23/19 2010    Virgina Norfolkuratolo, Adam, DO 12/24/19 0119

## 2019-12-23 NOTE — ED Triage Notes (Addendum)
C/o abscess to L side of abd x 1 week.  Denies fever/chills.

## 2019-12-23 NOTE — Discharge Instructions (Signed)
Please take Tylenol (acetaminophen) to relieve your pain.  You may take tylenol, up to 1,000 mg (two extra strength pills).  Do not take more than 3,000 mg tylenol in a 24 hour period.  Please check all medication labels as many medications such as pain and cold medications may contain tylenol. Please do not drink alcohol while taking this medication.  ° °You may have diarrhea from the antibiotics.  It is very important that you continue to take the antibiotics even if you get diarrhea unless a medical professional tells you that you may stop taking them.  If you stop too early the bacteria you are being treated for will become stronger and you may need different, more powerful antibiotics that have more side effects and worsening diarrhea.  Please stay well hydrated and consider probiotics as they may decrease the severity of your diarrhea.   ° °

## 2019-12-23 NOTE — ED Provider Notes (Addendum)
Medical screening examination/treatment/procedure(s) were conducted as a shared visit with non-physician practitioner(s) and myself.  I personally evaluated the patient during the encounter. Briefly, the patient is a 63 y.o. male abscess to the left side of his abdomen.  Overall superficial, mildly indurated with likely mild cellulitis surrounding.  Ultrasound showed superficial skin abscess likely from hair follicle.  Overall superficial.  No concern for deep space abscess.  No signs to suggest bowel or hernia.  Patient does not have inflammatory bowel disease and no concern for fistula.  I&D to be performed and will discharge antibiotics, warm compresses, follow-up primary care doctor.   EKG Interpretation None          Lennice Sites, DO 12/23/19 1723    Lennice Sites, DO 12/23/19 1723

## 2020-02-05 ENCOUNTER — Emergency Department (HOSPITAL_COMMUNITY)
Admission: EM | Admit: 2020-02-05 | Discharge: 2020-02-05 | Disposition: A | Discharge status: Home / Self Care | Payer: Self-pay | Attending: Emergency Medicine | Admitting: Emergency Medicine

## 2020-02-05 ENCOUNTER — Emergency Department (HOSPITAL_COMMUNITY): Payer: Self-pay

## 2020-02-05 ENCOUNTER — Encounter (HOSPITAL_COMMUNITY): Payer: Self-pay | Admitting: Emergency Medicine

## 2020-02-05 DIAGNOSIS — F17210 Nicotine dependence, cigarettes, uncomplicated: Secondary | ICD-10-CM | POA: Insufficient documentation

## 2020-02-05 DIAGNOSIS — M25561 Pain in right knee: Secondary | ICD-10-CM | POA: Insufficient documentation

## 2020-02-05 DIAGNOSIS — Z79899 Other long term (current) drug therapy: Secondary | ICD-10-CM | POA: Insufficient documentation

## 2020-02-05 MED ORDER — MELOXICAM 7.5 MG PO TABS: 7.5000 mg | ORAL_TABLET | Freq: Every day | ORAL | 0 refills | Status: AC

## 2020-02-05 NOTE — Discharge Instructions (Signed)
As we discussed, your x-ray was reassuring.  Your symptoms are most likely related to the trauma that he experienced a few days ago.  Take Mobic as directed.  Use knee immobilizer and crutches.  Elevate the leg and apply ice to help with pain and swelling.  Follow-up with orthopedics as directed.  As we discussed, I have marked the area of redness today.  If the redness starts extending outside of those lines, you need to return to emergency department.  Return also if you have any fevers, worsening pain, numbness/weakness.

## 2020-02-05 NOTE — ED Notes (Signed)
Per patient request, assisted patient to bus stop in wheelchair with security. Patient able to stand and ambulate from wheel chair to bench at bus stop without difficulty.

## 2020-02-05 NOTE — ED Provider Notes (Signed)
[facility] DEPT Provider Note   CSN: [id number] Arrival date & time: 02/05/20  1121     History Chief Complaint  Patient presents with  . Fall    Justin Fritz is a 64 y.o. male who presents for evaluation of right knee pain after mechanical fall that occurred on 02/01/2020.  He states that he was walking outside when he tripped, on the curb and landed directly on his knee.  He was able to go to work and has been ambulatory since but states he has had some worsening pain and swelling.  He states it is more painful to touch on the medial aspect of his right knee.  He has been taking Tylenol with minimal improvement.  He states he has noticed it to be swollen.  He has had some mild redness noted to the medial aspect.  He denies any fevers, numbness/weakness.  The history is provided by the patient.       History reviewed. No pertinent past medical history.  There are no problems to display for this patient.   History reviewed. No pertinent surgical history.     No family history on file.  Social History   Tobacco Use  . Smoking status: Current Every Day Smoker  . Smokeless tobacco: Never Used  Substance Use Topics  . Alcohol use: Yes  . Drug use: No    Home Medications Prior to Admission medications   Medication Sig Start Date End Date Taking? Authorizing Provider  Aspirin-Salicylamide-Caffeine (BC HEADACHE PO) Take 1 packet by mouth daily as needed (spinal back pain).    [provider]  CALCIUM PO Take 1 tablet by mouth daily.    [provider]  doxycycline (VIBRAMYCIN) 100 MG capsule Take 1 capsule (100 mg total) by mouth 2 (two) times daily. 11/12/19   Lawyer, Harrell Gave, PA-C  meloxicam (MOBIC) 7.5 MG tablet Take 1 tablet (7.5 mg total) by mouth daily for 14 days. 02/05/20 02/19/20  Volanda Napoleon, PA-C  ondansetron (ZOFRAN ODT) 4 MG disintegrating tablet 4mg  ODT q4 hours prn nausea/vomit 03/23/15   Julianne Rice,  MD  pantoprazole (PROTONIX) 40 MG tablet Take 1 tablet (40 mg total) by mouth daily. 03/23/15   Julianne Rice, MD  sucralfate (CARAFATE) 1 G tablet Take 1 tablet (1 g total) by mouth 4 (four) times daily. Patient not taking: Reported on 03/23/2015 07/23/13   Linton Flemings, MD  traMADol (ULTRAM) 50 MG tablet Take 1 tablet (50 mg total) by mouth every 6 (six) hours as needed for severe pain. 11/12/19   Dalia Heading, PA-C    Allergies    Patient has no known allergies.  Review of Systems   Review of Systems  Constitutional: Negative for fever.  Musculoskeletal:       Knee pain  Skin: Positive for color change.  Neurological: Negative for weakness and numbness.  All other systems reviewed and are negative.   Physical Exam Updated Vital Signs BP 98/64   Pulse 74   Temp 98.2 F (36.8 C) (Oral)   Resp 20   SpO2 100%   Physical Exam Vitals and nursing note reviewed.  Constitutional:      Appearance: He is well-developed.  HENT:     Head: Normocephalic and atraumatic.  Eyes:     General: No scleral icterus.       Right eye: No discharge.        Left eye: No discharge.     Conjunctiva/sclera: Conjunctivae normal.  Cardiovascular:  Pulses:          Dorsalis pedis pulses are 2+ on the right side and 2+ on the left side.  Pulmonary:     Effort: Pulmonary effort is normal.  Musculoskeletal:     Comments: Tenderness palpation noted to medial aspect of right knee.  He has some mild overlying soft tissue swelling.  No deformity or crepitus noted.  Flexion/extension of right knee intact.  Negative anterior and posterior drawer test.  No instability noted with varus or valgus stress.  He does have a small scabbed over area noted to medial aspect of the knee that has some mild surrounding erythema and warmth but that is very focal and has not diffuse throughout the entire knee.  No bony tenderness noted to right tib-fib, right ankle, right hip.  No tenderness palpation of the left  lower extremity.  Skin:    General: Skin is warm and dry.  Neurological:     Mental Status: He is alert.  Psychiatric:        Speech: Speech normal.        Behavior: Behavior normal.          ED Results / Procedures / Treatments   Labs (all labs ordered are listed, but only abnormal results are displayed) Labs Reviewed - No data to display  EKG None  Radiology DG Knee Complete 4 Views Right  Result Date: 02/05/2020 CLINICAL DATA:  Injury, fell 4 days ago, anteromedial pain EXAM: RIGHT KNEE - COMPLETE 4+ VIEW COMPARISON:  None FINDINGS: Osseous mineralization normal. Joint spaces preserved. No fracture, dislocation, or bone destruction. Small patellar spur at quadriceps tendon insertion. No joint effusion. Mild anterior infrapatellar and prepatellar soft tissue swelling. IMPRESSION: No acute osseous abnormalities. Electronically Signed   By: Ulyses Southward M.D.   On: 02/05/2020 12:22    Procedures Procedures (including critical care time)  Medications Ordered in ED Medications - No data to display  ED Course  I have reviewed the triage vital signs and the nursing notes.  Pertinent labs & imaging results that were available during my care of the patient were reviewed by me and considered in my medical decision making (see chart for details).    MDM Rules/Calculators/A&P                      64 year old male who presents for evaluation of right knee pain after falling on it 4 days ago.  Has been able to ambulate but reports worsening pain, swelling.  He has an area on the medial aspect that has been slightly red.  No fevers, numbness/weakness. Patient is afebrile, non-toxic appearing, sitting comfortably on examination table. Vital signs reviewed and stable. Patient is neurovascularly intact.  Exam, some mild soft tissue swelling noted to the knee.  He can flex and extend without difficulty.  He has tenderness noted to the medial aspect of the left knee.  He has a small scabbed  over lesion that has some mild surrounding erythema but this is very focal to the medial aspect is not diffuse.  I do not think this represents septic arthritis as the entire knee is not hot, warm.  Distally, he has good range of motion.  He does not have any history of gout.  Additionally, the area itself does not look infected.  I suspect this is most likely inflammation from the fall.  Also concern for fracture versus dislocation.  X-rays ordered at triage. History/physical is not concerning for ischemic  limb, DVT.   Knee x-ray shows some small patellar spur quadriceps insertion tendon.  He has mild anterior infrapatellar and prepatellar soft tissue swelling.  No other acute bony abnormalities.  Discussed results with patient.  We will plan to give him a short course of Mobic for pain relief.  At this time, his exam is not consistent with septic additionally, he only has this isolated area of redness that I think is more from irritation.  Do not feel this is infected.  I discussed Dr. Jacqulyn Bath who is agreeable to plan.  Additionally patient given immobilizer and crutches.  Good return precautions discussed with patient.  Will give outpatient orthopedic follow-up. At this time, patient exhibits no emergent life-threatening condition that require further evaluation in ED or admission. Patient had ample opportunity for questions and discussion. All patient's questions were answered with full understanding. Strict return precautions discussed. Patient expresses understanding and agreement to plan.   Portions of this note were generated with Scientist, clinical (histocompatibility and immunogenetics). Dictation errors may occur despite best attempts at proofreading.   Final Clinical Impression(s) / ED Diagnoses Final diagnoses:  Acute pain of right knee    Rx / DC Orders ED Discharge Orders         Ordered    meloxicam (MOBIC) 7.5 MG tablet  Daily     02/05/20 1701           Rosana Hoes 02/06/20 0055    Maia Plan, MD 02/06/20 1019

## 2020-02-05 NOTE — ED Triage Notes (Signed)
Per EMS, patient from hotel, c/o right knee pain after fall x4 days ago. Redness and swelling to right knee. Ambulatory with assistance.

## 2020-06-26 ENCOUNTER — Emergency Department (HOSPITAL_BASED_OUTPATIENT_CLINIC_OR_DEPARTMENT_OTHER)
Admission: EM | Admit: 2020-06-26 | Discharge: 2020-06-26 | Disposition: A | Discharge status: Home / Self Care | Payer: Medicare Other | Attending: Emergency Medicine | Admitting: Emergency Medicine

## 2020-06-26 ENCOUNTER — Other Ambulatory Visit: Payer: Self-pay

## 2020-06-26 ENCOUNTER — Encounter (HOSPITAL_BASED_OUTPATIENT_CLINIC_OR_DEPARTMENT_OTHER): Payer: Self-pay | Admitting: Emergency Medicine

## 2020-06-26 DIAGNOSIS — L040 Acute lymphadenitis of face, head and neck: Secondary | ICD-10-CM

## 2020-06-26 DIAGNOSIS — Z7982 Long term (current) use of aspirin: Secondary | ICD-10-CM | POA: Diagnosis not present

## 2020-06-26 DIAGNOSIS — F17210 Nicotine dependence, cigarettes, uncomplicated: Secondary | ICD-10-CM | POA: Insufficient documentation

## 2020-06-26 DIAGNOSIS — M542 Cervicalgia: Secondary | ICD-10-CM | POA: Diagnosis present

## 2020-06-26 LAB — CBC WITH DIFFERENTIAL/PLATELET
Abs Immature Granulocytes: 0.02 10*3/uL (ref 0.00–0.07)
Basophils Absolute: 0 10*3/uL (ref 0.0–0.1)
Basophils Relative: 0 %
Eosinophils Absolute: 0.2 10*3/uL (ref 0.0–0.5)
Eosinophils Relative: 4 %
HCT: 44 % (ref 39.0–52.0)
Hemoglobin: 14.2 g/dL (ref 13.0–17.0)
Immature Granulocytes: 0 %
Lymphocytes Relative: 29 %
Lymphs Abs: 1.6 10*3/uL (ref 0.7–4.0)
MCH: 27.1 pg (ref 26.0–34.0)
MCHC: 32.3 g/dL (ref 30.0–36.0)
MCV: 84 fL (ref 80.0–100.0)
Monocytes Absolute: 0.6 10*3/uL (ref 0.1–1.0)
Monocytes Relative: 11 %
Neutro Abs: 3.1 10*3/uL (ref 1.7–7.7)
Neutrophils Relative %: 56 %
Platelets: 217 10*3/uL (ref 150–400)
RBC: 5.24 MIL/uL (ref 4.22–5.81)
RDW: 15.5 % (ref 11.5–15.5)
WBC: 5.5 10*3/uL (ref 4.0–10.5)
nRBC: 0 % (ref 0.0–0.2)

## 2020-06-26 LAB — GROUP A STREP BY PCR: Group A Strep by PCR: NOT DETECTED

## 2020-06-26 LAB — BASIC METABOLIC PANEL
Anion gap: 9 (ref 5–15)
BUN: 12 mg/dL (ref 8–23)
CO2: 28 mmol/L (ref 22–32)
Calcium: 9 mg/dL (ref 8.9–10.3)
Chloride: 103 mmol/L (ref 98–111)
Creatinine, Ser: 0.93 mg/dL (ref 0.61–1.24)
GFR calc Af Amer: >60 mL/min (ref >60)
GFR calc non Af Amer: >60 mL/min (ref >60)
Glucose, Bld: 109 mg/dL — ABNORMAL HIGH (ref 70–99)
Potassium: 4.3 mmol/L (ref 3.5–5.1)
Sodium: 140 mmol/L (ref 135–145)

## 2020-06-26 NOTE — ED Provider Notes (Signed)
[facility] EMERGENCY DEPARTMENT Provider Note   CSN: [id number] Arrival date & time: 06/26/20  2330     History Chief Complaint  Patient presents with  . Neck Pain    Justin Fritz is a 64 y.o. male.  He is presenting complaining of right anterior neck pain that is been there for 3 days.  No known trauma.  Worse with palpation.  Feels a discomfort when he swallows.  No fevers or chills.  No sore throat.  Has poor dentition but does not feel any particular tooth pain.  The history is provided by the patient.  Neck Pain Pain location:  R side Quality:  Aching Pain radiates to:  Does not radiate Pain severity:  Moderate Pain is:  Same all the time Onset quality:  Gradual Duration:  3 days Timing:  Constant Progression:  Unchanged Chronicity:  New Relieved by:  None tried Worsened by:  Swallowing Ineffective treatments:  None tried Associated symptoms: no chest pain, no fever, no headaches and no visual change        History reviewed. No pertinent past medical history.  There are no problems to display for this patient.   History reviewed. No pertinent surgical history.     Family History  Problem Relation Age of Onset  . CAD Mother   . Hypertension Father     Social History   Tobacco Use  . Smoking status: Current Every Day Smoker    Packs/day: 0.50    Types: Cigarettes  . Smokeless tobacco: Never Used  Vaping Use  . Vaping Use: Never used  Substance Use Topics  . Alcohol use: Not Currently  . Drug use: No    Home Medications Prior to Admission medications   Medication Sig Start Date End Date Taking? Authorizing Provider  Aspirin-Salicylamide-Caffeine (BC HEADACHE PO) Take 1 packet by mouth daily as needed (spinal back pain).    [provider]  CALCIUM PO Take 1 tablet by mouth daily.    [provider]  doxycycline (VIBRAMYCIN) 100 MG capsule Take 1 capsule (100 mg total) by mouth 2 (two) times daily. 11/12/19    Lawyer, Cristal Deer, PA-C  ondansetron (ZOFRAN ODT) 4 MG disintegrating tablet 4mg  ODT q4 hours prn nausea/vomit 03/23/15   03/25/15, MD  pantoprazole (PROTONIX) 40 MG tablet Take 1 tablet (40 mg total) by mouth daily. 03/23/15   03/25/15, MD  sucralfate (CARAFATE) 1 G tablet Take 1 tablet (1 g total) by mouth 4 (four) times daily. Patient not taking: Reported on 03/23/2015 07/23/13   07/25/13, MD  traMADol (ULTRAM) 50 MG tablet Take 1 tablet (50 mg total) by mouth every 6 (six) hours as needed for severe pain. 11/12/19   11/14/19, PA-C    Allergies    Patient has no known allergies.  Review of Systems   Review of Systems  Constitutional: Negative for fever.  HENT: Negative for ear pain and sore throat.   Eyes: Negative for visual disturbance.  Respiratory: Negative for shortness of breath.   Cardiovascular: Negative for chest pain.  Gastrointestinal: Negative for abdominal pain.  Genitourinary: Negative for dysuria.  Musculoskeletal: Positive for neck pain.  Skin: Negative for rash.  Neurological: Negative for headaches.    Physical Exam Updated Vital Signs BP 120/84 (BP Location: Left Arm)   Pulse 70   Temp 97.7 F (36.5 C) (Oral)   Resp 18   Ht 5\' 9"  (1.753 m)   Wt 86.2 kg   SpO2 98%  BMI 28.06 kg/m   Physical Exam Vitals and nursing note reviewed.  Constitutional:      Appearance: He is well-developed.  HENT:     Head: Normocephalic and atraumatic.     Mouth/Throat:     Mouth: Mucous membranes are moist.     Pharynx: Posterior oropharyngeal erythema present. No oropharyngeal exudate.  Eyes:     Conjunctiva/sclera: Conjunctivae normal.  Neck:     Comments: Trachea midline.  Tender anterior cervical lymph node on the right.  No overlying erythema.  No fluctuance.  No particular parotid tenderness.  No trismus. Cardiovascular:     Rate and Rhythm: Normal rate and regular rhythm.     Heart sounds: No murmur heard.   Pulmonary:      Effort: Pulmonary effort is normal. No respiratory distress.     Breath sounds: Normal breath sounds.  Abdominal:     Palpations: Abdomen is soft.     Tenderness: There is no abdominal tenderness.  Musculoskeletal:     Cervical back: Neck supple. Tenderness present.  Skin:    General: Skin is warm and dry.  Neurological:     General: No focal deficit present.     Mental Status: He is alert.     GCS: GCS eye subscore is 4. GCS verbal subscore is 5. GCS motor subscore is 6.     ED Results / Procedures / Treatments   Labs (all labs ordered are listed, but only abnormal results are displayed) Labs Reviewed  BASIC METABOLIC PANEL - Abnormal; Notable for the following components:      Result Value   Glucose, Bld 109 (*)    All other components within normal limits  GROUP A STREP BY PCR  CBC WITH DIFFERENTIAL/PLATELET    EKG None  Radiology No results found.  Procedures Procedures (including critical care time)  Medications Ordered in ED Medications - No data to display  ED Course  I have reviewed the triage vital signs and the nursing notes.  Pertinent labs & imaging results that were available during my care of the patient were reviewed by me and considered in my medical decision making (see chart for details).  Clinical Course as of Jun 26 1933  Wed Jun 26, 2020  0826 Reviewed results with patient.  Normal labs strep test negative.  Likely adenitis.  In the setting of a smoker counseled him that he should return if this is growing larger or high fever or does not resolve after a few more days.   [MB]    Clinical Course User Index [MB] Terrilee Files, MD   MDM Rules/Calculators/A&P                          This patient complains of tender mass on right anterior neck; this involves an extensive number of treatment Options and is a complaint that carries with it a high risk of complications and Morbidity. The differential includes reactive lymph node, strep  throat, parotitis, sialoadenitis, less likely abscess, tumor in the setting of smoker.  I ordered, reviewed and interpreted labs, which included CBC normal chemistry normal other than mildly elevated glucose, strep test negative Previous records obtained and reviewed in epic, no relevant documents  After the interventions stated above, I reevaluated the patient and found patient to be hemodynamically stable.  Concerned with his smoking and alcohol and new neck mass although seems very acute in onset and tender leading towards more infectious inflammatory.  Likely viral.  Recommended follow-up with PCP and return if not improved in the next few days.  May require advanced imaging.  Final Clinical Impression(s) / ED Diagnoses Final diagnoses:  Acute cervical adenitis    Rx / DC Orders ED Discharge Orders    None       Terrilee Files, MD 06/26/20 Barry Brunner

## 2020-06-26 NOTE — Discharge Instructions (Addendum)
You were seen in the emergency department for tender area on the right side of your neck.  You had blood work and a test for strep throat that were unremarkable.  This is likely a tender lymph node.  He should use Tylenol ibuprofen warm salt water gargles.  If this area does not improve in the next few days or if you experience other concerning symptoms please return to the emergency department for further evaluation.

## 2020-06-26 NOTE — ED Notes (Signed)
ED Provider at bedside. 

## 2020-06-26 NOTE — ED Triage Notes (Signed)
Pt states he has a knot on the right side of his neck  States it is tender to touch  Sxs started on Monday

## 2020-11-05 ENCOUNTER — Emergency Department (HOSPITAL_BASED_OUTPATIENT_CLINIC_OR_DEPARTMENT_OTHER): Payer: Medicare Other

## 2020-11-05 ENCOUNTER — Encounter (HOSPITAL_BASED_OUTPATIENT_CLINIC_OR_DEPARTMENT_OTHER): Payer: Self-pay

## 2020-11-05 ENCOUNTER — Emergency Department (HOSPITAL_BASED_OUTPATIENT_CLINIC_OR_DEPARTMENT_OTHER)
Admission: EM | Admit: 2020-11-05 | Discharge: 2020-11-05 | Disposition: A | Discharge status: Home / Self Care | Payer: Medicare Other | Attending: Emergency Medicine | Admitting: Emergency Medicine

## 2020-11-05 ENCOUNTER — Other Ambulatory Visit: Payer: Self-pay

## 2020-11-05 DIAGNOSIS — F17210 Nicotine dependence, cigarettes, uncomplicated: Secondary | ICD-10-CM | POA: Diagnosis not present

## 2020-11-05 DIAGNOSIS — R110 Nausea: Secondary | ICD-10-CM

## 2020-11-05 DIAGNOSIS — K219 Gastro-esophageal reflux disease without esophagitis: Secondary | ICD-10-CM | POA: Diagnosis not present

## 2020-11-05 DIAGNOSIS — R1013 Epigastric pain: Secondary | ICD-10-CM | POA: Diagnosis present

## 2020-11-05 HISTORY — DX: Pure hypercholesterolemia, unspecified: E78.00

## 2020-11-05 LAB — COMPREHENSIVE METABOLIC PANEL
ALT: 19 U/L (ref 0–44)
AST: 19 U/L (ref 15–41)
Albumin: 4.2 g/dL (ref 3.5–5.0)
Alkaline Phosphatase: 96 U/L (ref 38–126)
Anion gap: 9 (ref 5–15)
BUN: 12 mg/dL (ref 8–23)
CO2: 26 mmol/L (ref 22–32)
Calcium: 9.6 mg/dL (ref 8.9–10.3)
Chloride: 101 mmol/L (ref 98–111)
Creatinine, Ser: 0.9 mg/dL (ref 0.61–1.24)
GFR, Estimated: >60 mL/min (ref >60)
Glucose, Bld: 99 mg/dL (ref 70–99)
Potassium: 4 mmol/L (ref 3.5–5.1)
Sodium: 136 mmol/L (ref 135–145)
Total Bilirubin: 0.4 mg/dL (ref 0.3–1.2)
Total Protein: 7.4 g/dL (ref 6.5–8.1)

## 2020-11-05 LAB — URINALYSIS, ROUTINE W REFLEX MICROSCOPIC
Bilirubin Urine: NEGATIVE
Glucose, UA: NEGATIVE mg/dL
Hgb urine dipstick: NEGATIVE
Ketones, ur: NEGATIVE mg/dL
Leukocytes,Ua: NEGATIVE
Nitrite: NEGATIVE
Protein, ur: NEGATIVE mg/dL
Specific Gravity, Urine: <1.005 — ABNORMAL LOW (ref 1.005–1.030)
pH: 7 (ref 5.0–8.0)

## 2020-11-05 LAB — CBC
HCT: 43.7 % (ref 39.0–52.0)
Hemoglobin: 14.1 g/dL (ref 13.0–17.0)
MCH: 26.8 pg (ref 26.0–34.0)
MCHC: 32.3 g/dL (ref 30.0–36.0)
MCV: 82.9 fL (ref 80.0–100.0)
Platelets: 264 10*3/uL (ref 150–400)
RBC: 5.27 MIL/uL (ref 4.22–5.81)
RDW: 14.7 % (ref 11.5–15.5)
WBC: 5.3 10*3/uL (ref 4.0–10.5)
nRBC: 0 % (ref 0.0–0.2)

## 2020-11-05 LAB — LIPASE, BLOOD: Lipase: 27 U/L (ref 11–51)

## 2020-11-05 MED ORDER — LIDOCAINE VISCOUS HCL 2 % MT SOLN
15.0000 mL | Freq: Once | OROMUCOSAL | Status: AC
  Administered 2020-11-05: 15 mL via ORAL
  Filled 2020-11-05: qty 15

## 2020-11-05 MED ORDER — PANTOPRAZOLE SODIUM 40 MG PO TBEC: 40.0000 mg | DELAYED_RELEASE_TABLET | Freq: Every day | ORAL | 1 refills | Status: AC

## 2020-11-05 MED ORDER — ALUM & MAG HYDROXIDE-SIMETH 200-200-20 MG/5ML PO SUSP
30.0000 mL | Freq: Once | ORAL | Status: AC
  Administered 2020-11-05: 30 mL via ORAL
  Filled 2020-11-05: qty 30

## 2020-11-05 MED ORDER — ONDANSETRON 4 MG PO TBDP
4.0000 mg | ORAL_TABLET | Freq: Once | ORAL | Status: AC
  Administered 2020-11-05: 4 mg via ORAL
  Filled 2020-11-05: qty 1

## 2020-11-05 MED ORDER — ONDANSETRON 4 MG PO TBDP: 4.0000 mg | ORAL_TABLET | Freq: Every day | ORAL | Status: DC

## 2020-11-05 MED ORDER — ONDANSETRON HCL 4 MG PO TABS
4.0000 mg | ORAL_TABLET | Freq: Once | ORAL | Status: DC
  Filled 2020-11-05: qty 1

## 2020-11-05 MED ORDER — PANTOPRAZOLE SODIUM 40 MG PO TBEC
40.0000 mg | DELAYED_RELEASE_TABLET | Freq: Every day | ORAL | Status: DC
  Administered 2020-11-05: 40 mg via ORAL
  Filled 2020-11-05: qty 1

## 2020-11-05 NOTE — ED Provider Notes (Signed)
[facility] EMERGENCY DEPARTMENT Provider Note   CSN: [id number] Arrival date & time: 11/05/20  1232     History Chief Complaint  Patient presents with  . Abdominal Pain    Justin Fritz is a 64 y.o. male.  HPI   Justin Fritz is a 64 y.o. gentleman with PMHx HLD who presents to the ED for evaluation of waxing and waning epigastric pain associated with nausea and subjective fevers over the past 9 days. He states his pain is worse when he is laying down and improves temporarily with TUMS, Rolace, and milk. He states that his pain is severe at times. He has vomited once last week, but there was no blood or bile in his vomit. He states he did have one episode of hematuria with bright red urine last week and occasionally has lower abdominal pain, although denies any flank pain. He had a sore throat last week, but denies any dysphagia. He denies any dark or bloody stools, diarrhea, or constipation, and states he had a bowel movement yesterday that was normal for him. He denies any history of pancreatitis or peptic ulcer. He does note he has had twinges of chest pain in the past with exertion he describes as a pressure, although has not had pain recently with his abdominal pain. He did use cocaine last week and smokes 1 pack of cigarettes per week but denies other drug use or alcohol use.   Past Medical History:  Diagnosis Date  . Hypercholesterolemia     History reviewed. No pertinent surgical history.  Patient denies any history of abdominal surgeries.    Family History  Problem Relation Age of Onset  . CAD Mother   . Hypertension Father   . CAD Father   Patient believes his mother and father both had heart attacks.   Social History   Tobacco Use  . Smoking status: Current Every Day Smoker    Packs/day: 0.20    Types: Cigarettes  . Smokeless tobacco: Never Used  Vaping Use  . Vaping Use: Never used  Substance Use Topics  . Alcohol use: Not Currently    Comment: hx  heavy drug use, in remission since 2019  . Drug use: Yes    Types: Cocaine    Comment: crack cocaine  Patient states he has not used alcohol in 2 years but previously drank heavy. He last used crack cocaine one week ago. He smokes 1 pack per week of cigarettes.  Home Medications Prior to Admission medications   Medication Sig Start Date End Date Taking? Authorizing Provider  Aspirin-Salicylamide-Caffeine (BC HEADACHE PO) Take 1 packet by mouth daily as needed (spinal back pain).    [provider]  CALCIUM PO Take 1 tablet by mouth daily.    [provider]  doxycycline (VIBRAMYCIN) 100 MG capsule Take 1 capsule (100 mg total) by mouth 2 (two) times daily. 11/12/19   Lawyer, Cristal Deer, PA-C  ondansetron (ZOFRAN ODT) 4 MG disintegrating tablet 4mg  ODT q4 hours prn nausea/vomit 03/23/15   03/25/15, MD  pantoprazole (PROTONIX) 40 MG tablet Take 1 tablet (40 mg total) by mouth daily. 11/05/20 11/05/21  13/9/22, MD  sucralfate (CARAFATE) 1 G tablet Take 1 tablet (1 g total) by mouth 4 (four) times daily. Patient not taking: Reported on 03/23/2015 07/23/13   07/25/13, MD  traMADol (ULTRAM) 50 MG tablet Take 1 tablet (50 mg total) by mouth every 6 (six) hours as needed for severe pain. 11/12/19   Lawyer,  Cristal Deer, PA-C  Patient has been prescribed the above medications but has poor follow up and states that he is not taking any antacids or other prescribed daily medications.   Allergies    Patient has no known allergies.  Review of Systems   Review of Systems: 10 point review of systems otherwise negative except as noted above in HPI.   Physical Exam Updated Vital Signs BP (!) 134/91 (BP Location: Right Arm)   Pulse 69   Temp 98.3 F (36.8 C) (Oral)   Resp 18   Ht 5\' 9"  (1.753 m)   Wt 79.4 kg   SpO2 100%   BMI 25.84 kg/m   Physical Exam   General: Patient appears well. No acute distress. Eyes: Sclera non-icteric. No conjunctival injection.    HENT: Moist mucus membranes. No nasal discharge.  Respiratory: Lungs are CTA, bilaterally. No wheezes, rales, or rhonchi.  Cardiovascular: Rate is slightly bradycardic. Rhythm is regular. No murmurs, rubs or gallops. No lower extremity pitting edema. Abdominal: Distended but soft, with moderate epigastric and mild suprapubic tenderness to palpation. Voluntary guarding present upon epigastric palpation without involuntary guarding or rebound tenderness. Bowel sounds intact.  GU: No CVA tenderness.  Neurological: Patient is alert and oriented x 3.  Skin: No lesions. No rashes.  Psych: Normal affect. Normal tone of voice.   ED Results / Procedures / Treatments   Labs (all labs ordered are listed, but only abnormal results are displayed) Labs Reviewed  URINALYSIS, ROUTINE W REFLEX MICROSCOPIC - Abnormal; Notable for the following components:      Result Value   Specific Gravity, Urine <1.005 (*)    All other components within normal limits  LIPASE, BLOOD  COMPREHENSIVE METABOLIC PANEL  CBC    EKG EKG Interpretation  Date/Time:  Tuesday November 05 2020 12:54:05 EST Ventricular Rate:  72 PR Interval:  192 QRS Duration: 86 QT Interval:  364 QTC Calculation: 398 R Axis:   -23 Text Interpretation: Normal sinus rhythm Normal ECG No significant change since prior 9/12 Confirmed by 11/12 [phone]) on 11/05/2020 2:00:52 PM   Radiology 13/08/2020 Abdomen Complete  Result Date: 11/05/2020 CLINICAL DATA:  Abdominal pain for 2 weeks EXAM: ABDOMEN ULTRASOUND COMPLETE COMPARISON:  None. FINDINGS: Gallbladder: There is apparent sludge in the gallbladder. No appreciable gallstones, gallbladder wall thickening, or pericholecystic fluid. No sonographic Murphy sign noted by sonographer. Common bile duct: Diameter: 3 mm. No intrahepatic, common hepatic, or common bile duct dilatation. Liver: No focal lesion identified. Within normal limits in parenchymal echogenicity. Portal vein is patent on color  Doppler imaging with normal direction of blood flow towards the liver. IVC: No abnormality visualized. Pancreas: No pancreatic mass or inflammatory focus. Spleen: Size and appearance within normal limits. Right Kidney: Length: 11.2 cm. Echogenicity within normal limits. No mass or hydronephrosis visualized. Left Kidney: Length: 10.3 cm. Echogenicity within normal limits. No mass or hydronephrosis visualized. Abdominal aorta: No aneurysm visualized. Other findings: No evident ascites. IMPRESSION: Sludge noted in gallbladder. No appreciable gallstones, gallbladder wall thickening, or pericholecystic fluid. Study otherwise unremarkable. Electronically Signed   By: 13/08/2020 III M.D.   On: 11/05/2020 19:24    Procedures Procedures (including critical care time)  Medications Ordered in ED Medications  pantoprazole (PROTONIX) EC tablet 40 mg (40 mg Oral Given 11/05/20 1557)  ondansetron (ZOFRAN-ODT) disintegrating tablet 4 mg (4 mg Oral Given 11/05/20 1601)  alum & mag hydroxide-simeth (MAALOX/MYLANTA) 200-200-20 MG/5ML suspension 30 mL (30 mLs Oral Given 11/05/20 1635)  And  lidocaine (XYLOCAINE) 2 % viscous mouth solution 15 mL (15 mLs Oral Given 11/05/20 1635)    ED Course  I have reviewed the triage vital signs and the nursing notes.  Pertinent labs & imaging results that were available during my care of the patient were reviewed by me and considered in my medical decision making (see chart for details).    MDM Rules/Calculators/A&P                          Patient's epigastric pain and nausea, worse with spicy foods and with laying flat, improved by antacids is most consistent with GERD vs. Possible PUD; also on the differential are gallbladder pathology, especially cholelithiasis given pain is worse after fatty foods but patient remains afebrile here in the ED, acute pancreatitis, vs. atypical chest pain.   Patient does have intermittent asymptomatic bradycardia on examination,  although EKG shows NSR at a rate of 72bpm without T wave inversions or ST segment changes to suggest acute ischemia. Lipase is normal, making acute pancreatitis much less likely and chronic pancreatitis unlikely as patient denies history of similar pain. CBC is unremarkable without anemia and patient denies history of bloody or dark stools and he appears to be resting comfortably, making PUD less likely. Patient is afebrile without leukocytosis, hyperbilirubinemia, or transaminitis, making cholecystitis vs. Other liver or gallbladder pathology less likely. CMP is otherwise unremarkable.   Overall, patient's symptoms are most consistent with GERD vs. Intermittent cholelithiasis.  - Will give Zofran 4mg  PO for nausea - Will give Maalox and Xylocaine mouth solution - Will start Protonix 40mg  daily x 6 weeks  - Given degree of abdominal tenderness on examination, will check complete ultrasound of the abdomen to rule out perforation, gallbladder pathology, and hydronephrosis.   7:45pm: There is sludge within the gallbladder, but no gallstones or gallbladder wall thickening or pericholecystic fluid, or ductal dilation to suggest cholelithiasis or cholecystitis as cause of patient's symptoms. No hydronephrosis or other kidney mass. No pancreatic inflammation or liver lesion.   - Given unremarkable labs and stable vitals and clinical picture, patient is stable for discharge home with outpatient follow up and Protonix 40mg  daily in addition to [facility] as needed for suspected GERD. He would also benefit from further outpatient workup of reported hematuria, although his urine was clear here with no signs of infection or hemoglobin.   Final Clinical Impression(s) / ED Diagnoses Final diagnoses:  Gastroesophageal reflux disease, unspecified whether esophagitis present  Nausea    Rx / DC Orders ED Discharge Orders         Ordered    pantoprazole (PROTONIX) 40 MG tablet  Daily        11/05/20 1615          , MD 11/05/2020, 7:55 PM Pager: 13/09/21    Glenford Bayley, MD 11/05/20 [phone]    Glenford Bayley, MD 11/06/20 [phone]

## 2020-11-05 NOTE — Discharge Instructions (Addendum)
Justin Fritz,   Today, we discussed that your abdominal pain and nausea, worse after spicy and fatty foods and at night, is most consistent with GERD (gastro esophageal reflux disease) due to excess acid in the stomach.   Your lab work in the hospital returned unremarkable, without findings concerning for pancreatitis.   I have prescribed Protonix to the [facility] on [facility]. Please take 1 pill once daily in the mornings for 6 weeks. I have given you one refill. You may continue to take TUMS as needed; however, taking too many TUMS can worsen your reflux so only use them occasionally as needed. You may also pick up Maalox to take 1-3 hours after each meal and before bedtime as needed - it is available over the counter.  Please make an appointment with a primary care physician for regular follow up and mention this hospital stay and abdominal pain to them for further evaluation.   Please return to the ED if your pain continues despite Protonix, you experience fevers or chills, or bleeding per rectum, or any other concerns.   Thank you and take care,  Dr. Laddie Aquas

## 2020-11-05 NOTE — ED Triage Notes (Signed)
Pt c/o epigastric, left side abd pain x 10 days-denies n/v/d-NAD-to triage in w/c

## 2020-11-05 NOTE — ED Notes (Signed)
Pt refused IV at this time, pt states has already had labs collected.

## 2020-11-05 NOTE — ED Notes (Signed)
Pt on monitor and cycling vitals 

## 2021-02-26 ENCOUNTER — Encounter (HOSPITAL_BASED_OUTPATIENT_CLINIC_OR_DEPARTMENT_OTHER): Payer: Self-pay | Admitting: Emergency Medicine

## 2021-02-26 ENCOUNTER — Other Ambulatory Visit (HOSPITAL_COMMUNITY): Payer: Self-pay | Admitting: Emergency Medicine

## 2021-02-26 ENCOUNTER — Other Ambulatory Visit: Payer: Self-pay

## 2021-02-26 ENCOUNTER — Emergency Department (HOSPITAL_BASED_OUTPATIENT_CLINIC_OR_DEPARTMENT_OTHER)
Admission: EM | Admit: 2021-02-26 | Discharge: 2021-02-26 | Disposition: A | Discharge status: Home / Self Care | Payer: Medicare HMO | Attending: Emergency Medicine | Admitting: Emergency Medicine

## 2021-02-26 DIAGNOSIS — T24232A Burn of second degree of left lower leg, initial encounter: Secondary | ICD-10-CM | POA: Insufficient documentation

## 2021-02-26 DIAGNOSIS — T300 Burn of unspecified body region, unspecified degree: Secondary | ICD-10-CM

## 2021-02-26 DIAGNOSIS — X118XXA Contact with other hot tap-water, initial encounter: Secondary | ICD-10-CM | POA: Insufficient documentation

## 2021-02-26 DIAGNOSIS — L03116 Cellulitis of left lower limb: Secondary | ICD-10-CM | POA: Insufficient documentation

## 2021-02-26 DIAGNOSIS — L0390 Cellulitis, unspecified: Secondary | ICD-10-CM

## 2021-02-26 DIAGNOSIS — T24032A Burn of unspecified degree of left lower leg, initial encounter: Secondary | ICD-10-CM | POA: Diagnosis present

## 2021-02-26 DIAGNOSIS — Z7982 Long term (current) use of aspirin: Secondary | ICD-10-CM | POA: Insufficient documentation

## 2021-02-26 DIAGNOSIS — F17210 Nicotine dependence, cigarettes, uncomplicated: Secondary | ICD-10-CM | POA: Insufficient documentation

## 2021-02-26 MED ORDER — CEPHALEXIN 500 MG PO CAPS: 500.0000 mg | ORAL_CAPSULE | Freq: Two times a day (BID) | ORAL | 0 refills | Status: AC

## 2021-02-26 MED ORDER — CEPHALEXIN 250 MG PO CAPS
500.0000 mg | ORAL_CAPSULE | Freq: Once | ORAL | Status: AC
  Administered 2021-02-26: 500 mg via ORAL
  Filled 2021-02-26: qty 2

## 2021-02-26 MED FILL — CEPHALEXIN 500 MG CAPSULE: 500 | 5 days supply | Qty: 10 | Fill #0
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
## 2021-06-24 DIAGNOSIS — Z8249 Family history of ischemic heart disease and other diseases of the circulatory system: Secondary | ICD-10-CM | POA: Diagnosis not present

## 2021-06-24 DIAGNOSIS — G8929 Other chronic pain: Secondary | ICD-10-CM | POA: Diagnosis not present

## 2021-06-24 DIAGNOSIS — Z720 Tobacco use: Secondary | ICD-10-CM | POA: Diagnosis not present

## 2021-06-24 DIAGNOSIS — Z91040 Latex allergy status: Secondary | ICD-10-CM | POA: Diagnosis not present

## 2021-06-24 DIAGNOSIS — Z833 Family history of diabetes mellitus: Secondary | ICD-10-CM | POA: Diagnosis not present

## 2021-06-24 DIAGNOSIS — Z5948 Other specified lack of adequate food: Secondary | ICD-10-CM | POA: Diagnosis not present

## 2021-06-24 DIAGNOSIS — Z803 Family history of malignant neoplasm of breast: Secondary | ICD-10-CM | POA: Diagnosis not present

## 2021-06-24 DIAGNOSIS — Z791 Long term (current) use of non-steroidal anti-inflammatories (NSAID): Secondary | ICD-10-CM | POA: Diagnosis not present

## 2022-01-22 DIAGNOSIS — H5213 Myopia, bilateral: Secondary | ICD-10-CM | POA: Diagnosis not present

## 2022-02-14 IMAGING — US US ABDOMEN COMPLETE
1 series · 14 of 25 positions shown · non-contrast
Comparison: None.

CLINICAL DATA: Abdominal pain for 2 weeks

EXAM:
ABDOMEN ULTRASOUND COMPLETE

[Series 1: us abdomen complete · 84 acquisitions, 14 frames shown]
[im 1/84]
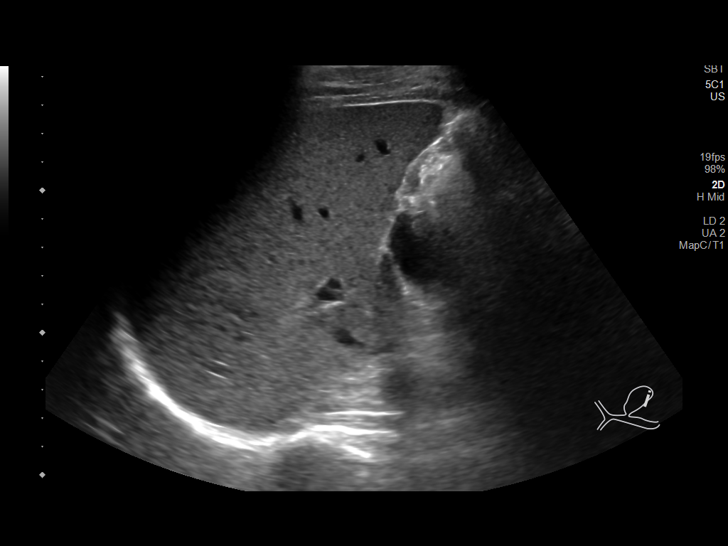
[im 7/84]
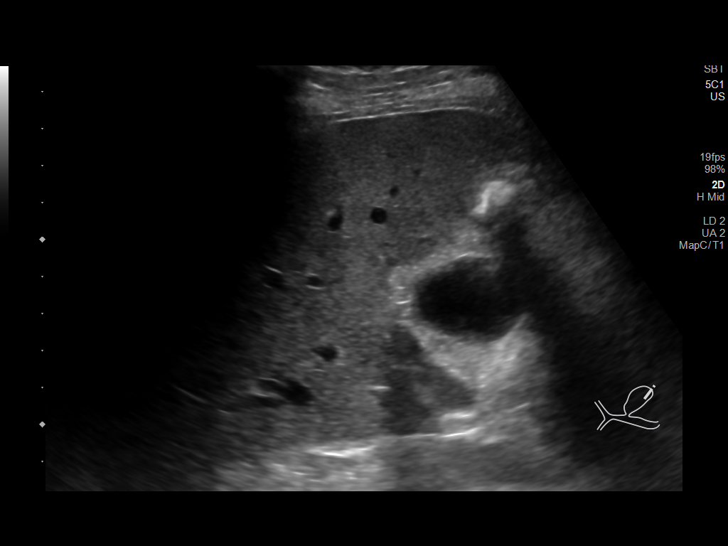
[im 14/84]
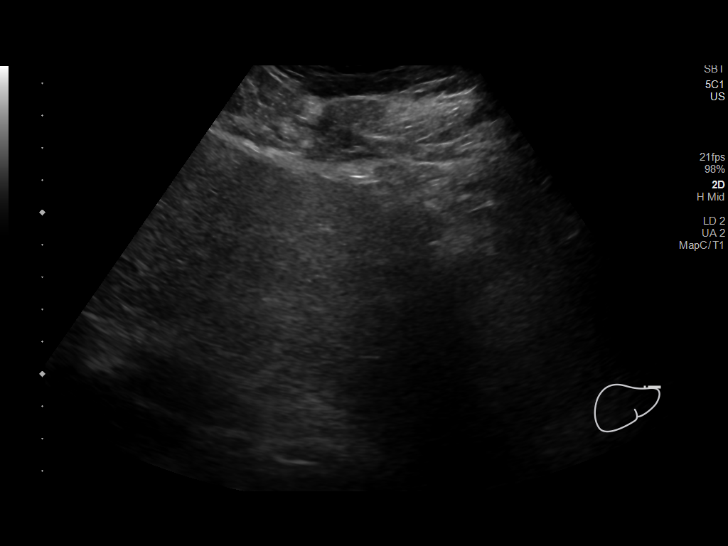
[im 21/84]
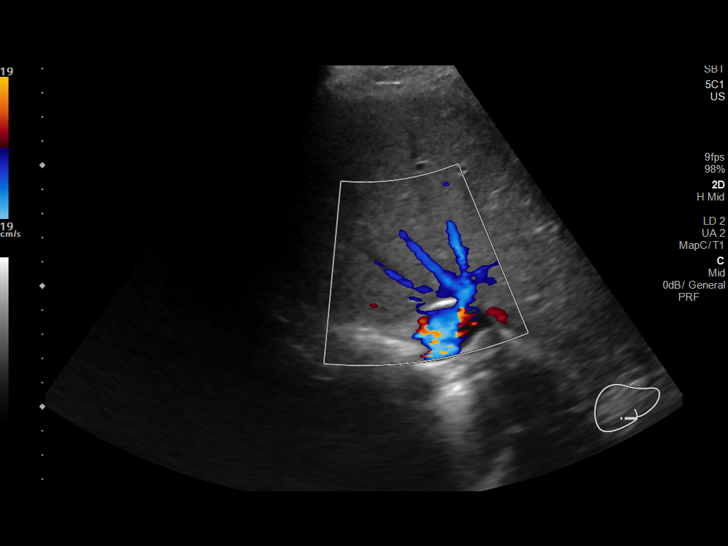
[im 28/84]
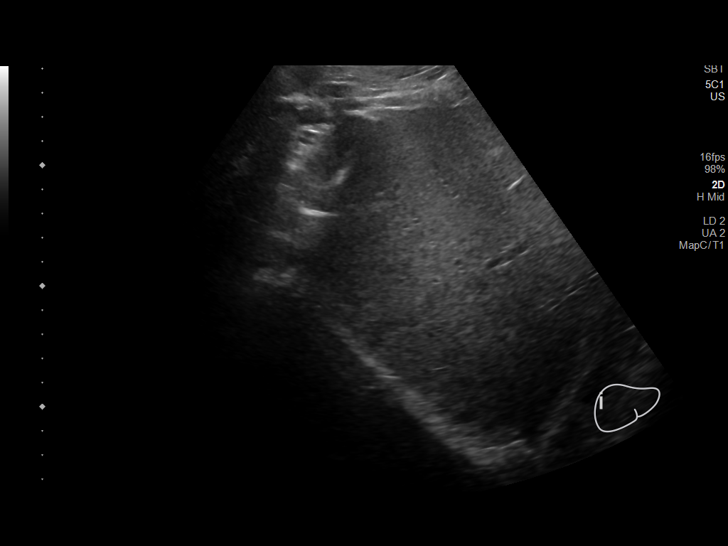
[im 32/84]
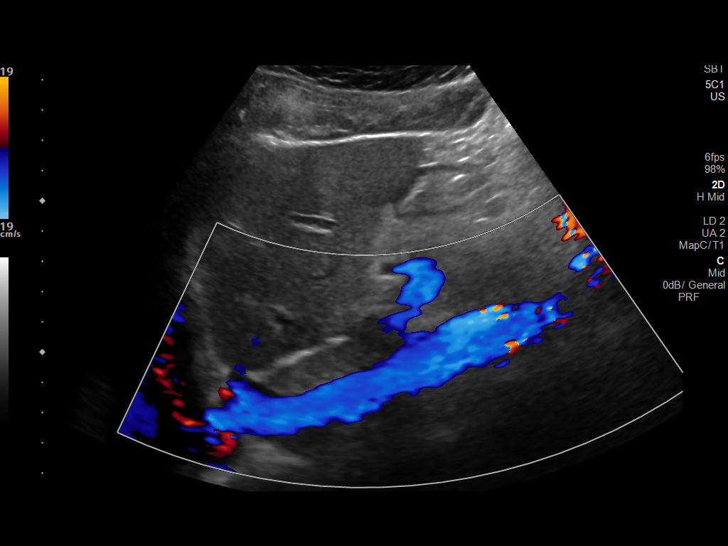
[im 39/84]
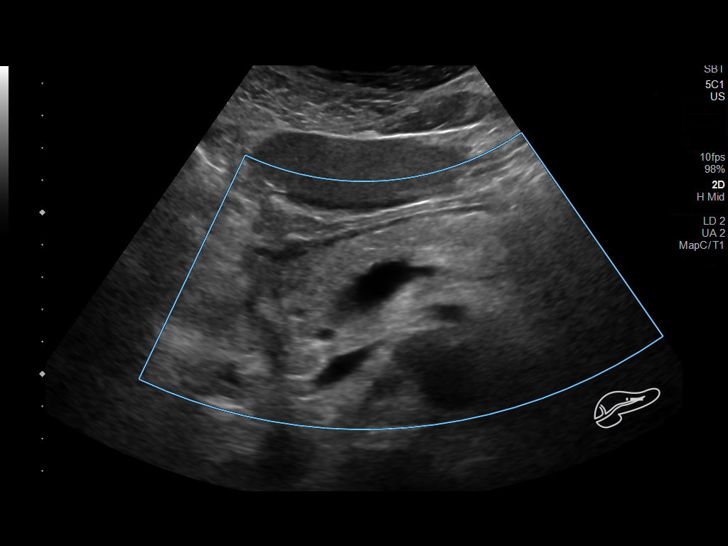
[im 45/84]
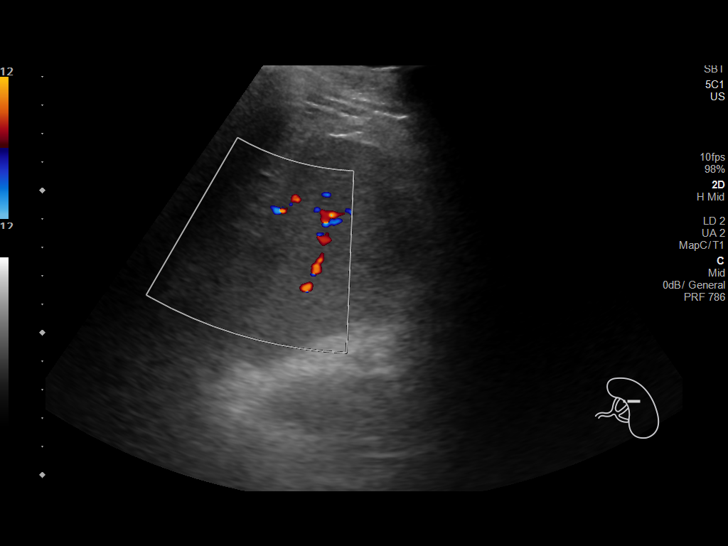
[im 52/84]
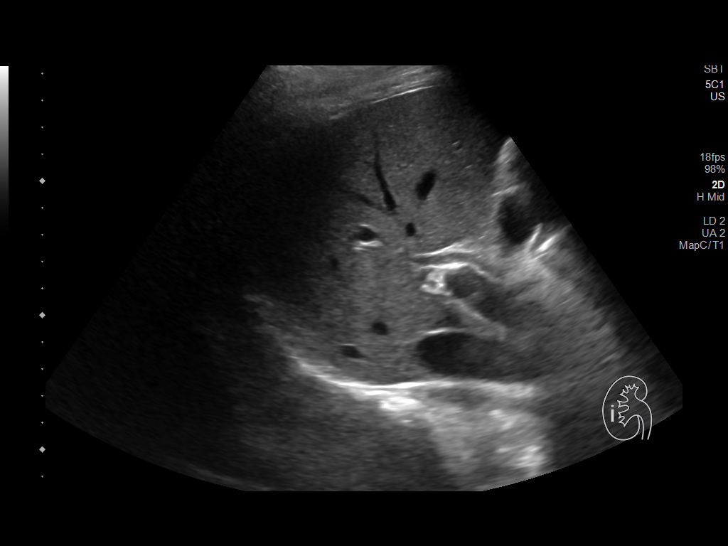
[im 56/84]
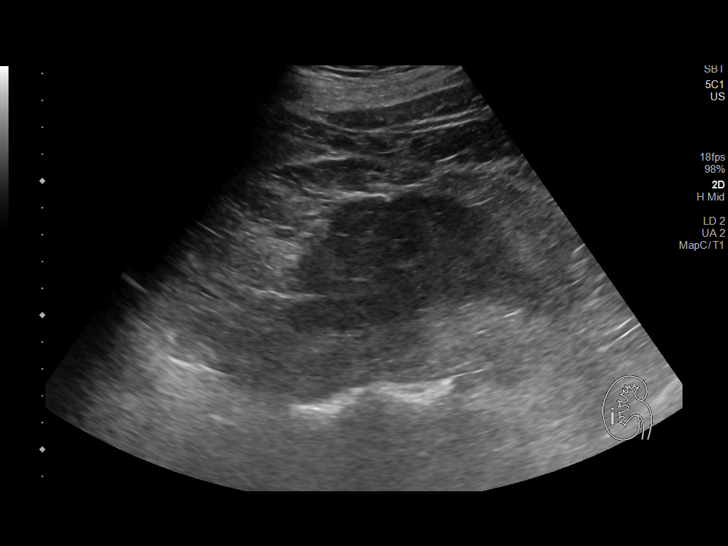
[im 63/84]
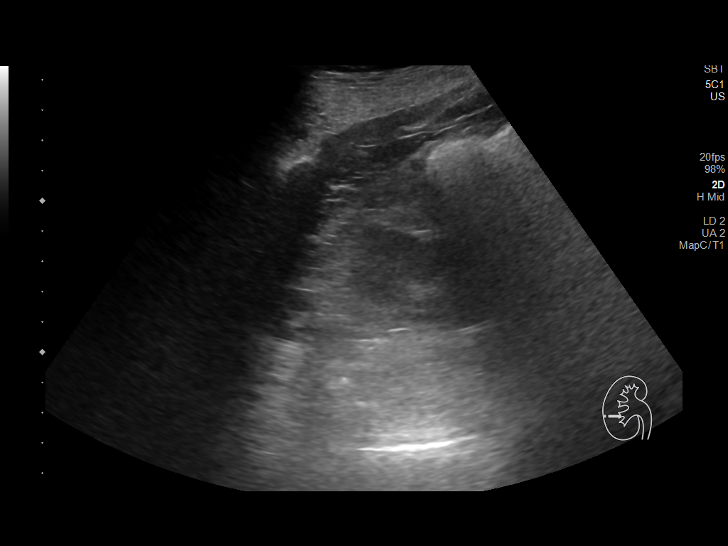
[im 70/84]
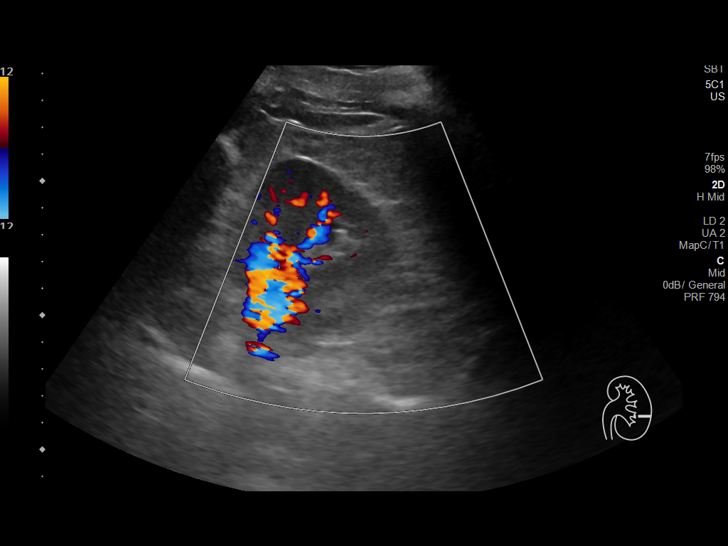
[im 77/84]
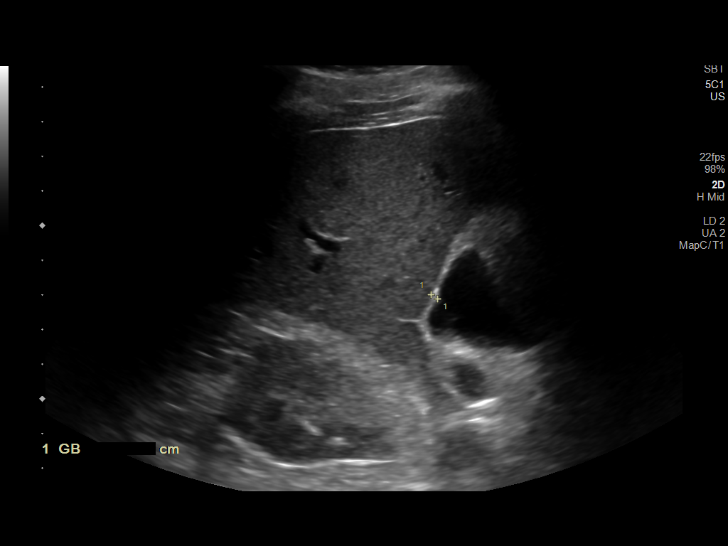
[im 84/84]
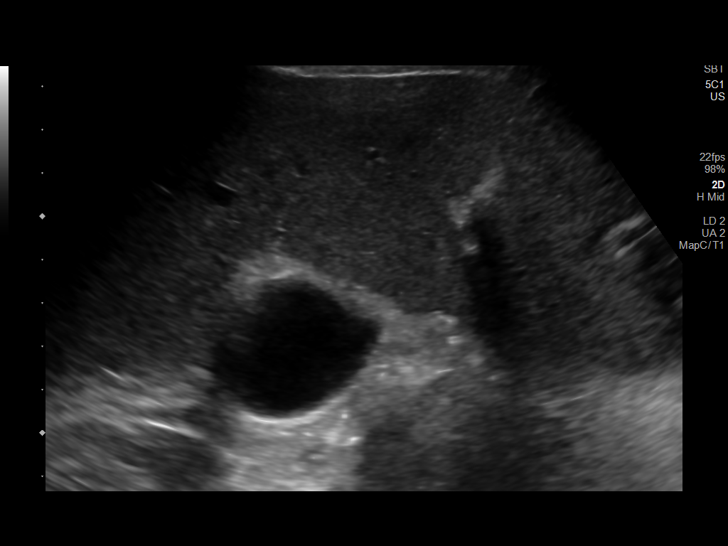

[14 of 25 positions shown; findings below may reference images not displayed]

FINDINGS: Gallbladder: There is apparent sludge in the gallbladder. No
appreciable gallstones, gallbladder wall thickening, or
pericholecystic fluid. No sonographic Murphy sign noted by
sonographer.

Common bile duct: Diameter: 3 mm. No intrahepatic, common hepatic,
or common bile duct dilatation.

Liver: No focal lesion identified. Within normal limits in
parenchymal echogenicity. Portal vein is patent on color Doppler
imaging with normal direction of blood flow towards the liver.

IVC: No abnormality visualized.

Pancreas: No pancreatic mass or inflammatory focus.

Spleen: Size and appearance within normal limits.

Right Kidney: Length: 11.2 cm. Echogenicity within normal limits. No
mass or hydronephrosis visualized.

Left Kidney: Length: 10.3 cm. Echogenicity within normal limits. No
mass or hydronephrosis visualized.

Abdominal aorta: No aneurysm visualized.

Other findings: No evident ascites.
IMPRESSION: Sludge noted in gallbladder. No appreciable gallstones, gallbladder
wall thickening, or pericholecystic fluid.

Study otherwise unremarkable.

## 2022-02-18 ENCOUNTER — Ambulatory Visit: Payer: Medicare HMO | Admitting: Family Medicine

## 2022-04-06 ENCOUNTER — Ambulatory Visit: Payer: Self-pay | Admitting: Family Medicine

## 2022-04-06 ENCOUNTER — Telehealth: Payer: Self-pay | Admitting: Family Medicine

## 2022-04-06 NOTE — Telephone Encounter (Signed)
Sent no show letter 04/06/22.  ?

## 2022-04-07 NOTE — Telephone Encounter (Signed)
1st no show, fee waived ?

## 2023-02-24 DIAGNOSIS — Z79899 Other long term (current) drug therapy: Secondary | ICD-10-CM | POA: Diagnosis not present

## 2023-04-07 DIAGNOSIS — J449 Chronic obstructive pulmonary disease, unspecified: Secondary | ICD-10-CM | POA: Diagnosis not present

## 2023-04-07 DIAGNOSIS — Z0289 Encounter for other administrative examinations: Secondary | ICD-10-CM | POA: Diagnosis not present

## 2023-04-07 DIAGNOSIS — M545 Low back pain, unspecified: Secondary | ICD-10-CM | POA: Diagnosis not present

## 2023-04-07 DIAGNOSIS — R7303 Prediabetes: Secondary | ICD-10-CM | POA: Diagnosis not present

## 2023-04-07 DIAGNOSIS — I700 Atherosclerosis of aorta: Secondary | ICD-10-CM | POA: Diagnosis not present

## 2023-04-07 DIAGNOSIS — E785 Hyperlipidemia, unspecified: Secondary | ICD-10-CM | POA: Diagnosis not present

## 2023-04-07 DIAGNOSIS — R21 Rash and other nonspecific skin eruption: Secondary | ICD-10-CM | POA: Diagnosis not present

## 2023-04-26 DIAGNOSIS — Z23 Encounter for immunization: Secondary | ICD-10-CM | POA: Diagnosis not present

## 2023-04-26 DIAGNOSIS — R109 Unspecified abdominal pain: Secondary | ICD-10-CM | POA: Diagnosis not present

## 2023-04-26 DIAGNOSIS — Z0289 Encounter for other administrative examinations: Secondary | ICD-10-CM | POA: Diagnosis not present

## 2023-04-30 DIAGNOSIS — Z1211 Encounter for screening for malignant neoplasm of colon: Secondary | ICD-10-CM | POA: Diagnosis not present

## 2023-06-03 DIAGNOSIS — M79605 Pain in left leg: Secondary | ICD-10-CM | POA: Diagnosis not present

## 2023-06-03 DIAGNOSIS — Z0289 Encounter for other administrative examinations: Secondary | ICD-10-CM | POA: Diagnosis not present

## 2024-02-11 ENCOUNTER — Encounter (HOSPITAL_COMMUNITY): Payer: Self-pay

## 2024-02-11 ENCOUNTER — Ambulatory Visit (HOSPITAL_COMMUNITY)
Admission: EM | Admit: 2024-02-11 | Discharge: 2024-02-11 | Disposition: A | Discharge status: Home / Self Care | Payer: Medicare HMO | Attending: Family Medicine | Admitting: Family Medicine

## 2024-02-11 ENCOUNTER — Ambulatory Visit (HOSPITAL_COMMUNITY): Payer: Medicare HMO

## 2024-02-11 DIAGNOSIS — R0789 Other chest pain: Secondary | ICD-10-CM | POA: Diagnosis not present

## 2024-02-11 DIAGNOSIS — R051 Acute cough: Secondary | ICD-10-CM | POA: Diagnosis not present

## 2024-02-11 MED ORDER — PREDNISONE 20 MG PO TABS: 40.0000 mg | ORAL_TABLET | Freq: Every day | ORAL | 0 refills | Status: AC

## 2024-02-11 MED ORDER — BENZONATATE 100 MG PO CAPS: 100.0000 mg | ORAL_CAPSULE | Freq: Three times a day (TID) | ORAL | 0 refills | Status: AC | PRN

## 2024-02-11 NOTE — ED Triage Notes (Addendum)
Pt c/o of pain below his sternum that started this morning. Pt has had a cough for three weeks as well.   Interventions: Cough medication, with no relief

## 2024-02-11 NOTE — Discharge Instructions (Addendum)
Your EKG did not show any serious abnormality  Your chest x-ray did not show any pneumonia or fluid.  Take prednisone 20 mg--2 daily for 5 days  Take benzonatate 100 mg, 1 tab every 8 hours as needed for cough.  There is a question of possibly an abnormality of the middle of your chest on x-ray that could be an aneurysm.  It may not be that.  Please follow-up with your primary care about this issue.  If you do not have a primary care, then you can use the QR code/website at the back of the summary paperwork to schedule yourself a new patient appointment with primary care  If you worsen anyway, then please go to the emergency room

## 2024-02-11 NOTE — ED Provider Notes (Addendum)
[facility]    CSN: [id number] Arrival date & time: 02/11/24  1559      History   Chief Complaint Chief Complaint  Patient presents with   Chest Pain   Cough    HPI Justin Fritz is a 68 y.o. male.    Chest Pain Associated symptoms: cough   Cough Associated symptoms: chest pain   Here for sharp chest pain at his lower sternum that began today.  Is been fairly constant.  No associated shortness of breath or nausea or vomiting or diaphoresis or palpitations.  He has been coughing for about a month.  He stated he had cold symptoms about 1 month ago and that has improved.  Does sound like he is still coughed a lot today.  Sometimes he is producing mucus with the cough.    NKDA  He denies a history of hypertension or diabetes  Past Medical History:  Diagnosis Date   Hypercholesterolemia     There are no active problems to display for this patient.   History reviewed. No pertinent surgical history.     Home Medications    Prior to Admission medications   Medication Sig Start Date End Date Taking? Authorizing Provider  benzonatate (TESSALON) 100 MG capsule Take 1 capsule (100 mg total) by mouth 3 (three) times daily as needed for cough. 02/11/24  Yes Zenia Resides, MD  predniSONE (DELTASONE) 20 MG tablet Take 2 tablets (40 mg total) by mouth daily with breakfast for 5 days. 02/11/24 02/16/24 Yes Zenia Resides, MD  CALCIUM PO Take 1 tablet by mouth daily.    [provider]  pantoprazole (PROTONIX) 40 MG tablet Take 1 tablet (40 mg total) by mouth daily. 11/05/20 11/05/21  Glenford Bayley, MD  sucralfate (CARAFATE) 1 G tablet Take 1 tablet (1 g total) by mouth 4 (four) times daily. Patient not taking: Reported on 03/23/2015 07/23/13   Marisa Severin, MD    Family History Family History  Problem Relation Age of Onset   CAD Mother    Hypertension Father    CAD Father     Social History Social History   Tobacco Use   Smoking status:  Every Day    Current packs/day: 0.20    Types: Cigarettes   Smokeless tobacco: Never  Vaping Use   Vaping status: Never Used  Substance Use Topics   Alcohol use: Not Currently    Comment: hx heavy drug use, in remission since 2019   Drug use: Not Currently    Types: Cocaine    Comment: crack cocaine     Allergies   Patient has no known allergies.   Review of Systems Review of Systems  Respiratory:  Positive for cough.   Cardiovascular:  Positive for chest pain.     Physical Exam Triage Vital Signs ED Triage Vitals  Encounter Vitals Group     BP 02/11/24 1704 134/87     Systolic BP Percentile --      Diastolic BP Percentile --      Pulse Rate 02/11/24 1704 65     Resp 02/11/24 1704 18     Temp 02/11/24 1704 97.7 F (36.5 C)     Temp Source 02/11/24 1704 Oral     SpO2 02/11/24 1704 98 %     Weight --      Height --      Head Circumference --      Peak Flow --      Pain Score  02/11/24 1706 7     Pain Loc --      Pain Education --      Exclude from Growth Chart --    No data found.  Updated Vital Signs BP 134/87 (BP Location: Right Arm)   Pulse 65   Temp 97.7 F (36.5 C) (Oral)   Resp 18   SpO2 98%   Visual Acuity Right Eye Distance:   Left Eye Distance:   Bilateral Distance:    Right Eye Near:   Left Eye Near:    Bilateral Near:     Physical Exam Vitals reviewed.  Constitutional:      General: He is not in acute distress.    Appearance: He is not toxic-appearing.  HENT:     Mouth/Throat:     Mouth: Mucous membranes are moist.     Pharynx: No oropharyngeal exudate or posterior oropharyngeal erythema.  Eyes:     Extraocular Movements: Extraocular movements intact.     Conjunctiva/sclera: Conjunctivae normal.     Pupils: Pupils are equal, round, and reactive to light.  Cardiovascular:     Rate and Rhythm: Normal rate and regular rhythm.     Heart sounds: No murmur heard. Pulmonary:     Effort: Pulmonary effort is normal. No respiratory  distress.     Breath sounds: Normal breath sounds. No stridor. No wheezing, rhonchi or rales.  Chest:     Chest wall: Tenderness (He is very tender on either side of his sternum at the inferior end.) present.  Musculoskeletal:     Cervical back: Neck supple.  Lymphadenopathy:     Cervical: No cervical adenopathy.  Skin:    Capillary Refill: Capillary refill takes less than 2 seconds.     Coloration: Skin is not jaundiced or pale.  Neurological:     General: No focal deficit present.     Mental Status: He is alert and oriented to person, place, and time.  Psychiatric:        Behavior: Behavior normal.      UC Treatments / Results  Labs (all labs ordered are listed, but only abnormal results are displayed) Labs Reviewed - No data to display  EKG   Radiology No results found.  Procedures Procedures (including critical care time)  Medications Ordered in UC Medications - No data to display  Initial Impression / Assessment and Plan / UC Course  I have reviewed the triage vital signs and the nursing notes.  Pertinent labs & imaging results that were available during my care of the patient were reviewed by me and considered in my medical decision making (see chart for details).     EKG shows sinus bradycardia with a first-degree AV block, with a rate of 54.  There are no ST segment elevations or depressions.  Chest x-ray by my review does not show any infiltrate or bony abnormality.  There is a possibility of a very mildly widened mediastinum.  Prednisone is sent in for what appears to be costochondritis, and for possible bronchitis.  And some Tessalon Perles are sent in for the cough.  I have asked him to follow-up with his primary care.  To the ER if worse in any way  I did discuss the x-ray with him and made him an appointment with primary care. Final Clinical Impressions(s) / UC Diagnoses   Final diagnoses:  Acute cough  Chest wall pain     Discharge  Instructions      Your EKG did not  show any serious abnormality  Your chest x-ray did not show any pneumonia or fluid.  Take prednisone 20 mg--2 daily for 5 days  Take benzonatate 100 mg, 1 tab every 8 hours as needed for cough.  There is a question of possibly an abnormality of the middle of your chest on x-ray that could be an aneurysm.  It may not be that.  Please follow-up with your primary care about this issue.  If you do not have a primary care, then you can use the QR code/website at the back of the summary paperwork to schedule yourself a new patient appointment with primary care  If you worsen anyway, then please go to the emergency room     ED Prescriptions     Medication Sig Dispense Auth. Provider   benzonatate (TESSALON) 100 MG capsule Take 1 capsule (100 mg total) by mouth 3 (three) times daily as needed for cough. 21 capsule Zenia Resides, MD   predniSONE (DELTASONE) 20 MG tablet Take 2 tablets (40 mg total) by mouth daily with breakfast for 5 days. 10 tablet Marlinda Mike Janace Aris, MD      PDMP not reviewed this encounter.   Zenia Resides, MD 02/11/24 1813    Zenia Resides, MD 02/11/24 [id number]

## 2024-03-16 ENCOUNTER — Ambulatory Visit: Payer: Medicare HMO | Admitting: Internal Medicine

## 2024-03-16 NOTE — Progress Notes (Deleted)
  [facility]ARE [facility] [address] [place] [address] Dept: [phone] Dept Fax: [phone]  New Patient Office Visit  Subjective:   Justin Fritz 1955-12-30 03/16/2024  No chief complaint on file.   HPI: Justin Fritz presents today to establish care at [facility]. Introduced to Publishing rights manager role and practice setting.  All questions answered.  Concerns: See below   Discussed the use of AI scribe software for clinical note transcription with the patient, who gave verbal consent to proceed.  History of Present Illness             The following portions of the patient's history were reviewed and updated as appropriate: past medical history, past surgical history, family history, social history, allergies, medications, and problem list.   There are no active problems to display for this patient.  Past Medical History:  Diagnosis Date   Hypercholesterolemia    No past surgical history on file. Family History  Problem Relation Age of Onset   CAD Mother    Hypertension Father    CAD Father     Current Outpatient Medications:    benzonatate (TESSALON) 100 MG capsule, Take 1 capsule (100 mg total) by mouth 3 (three) times daily as needed for cough., Disp: 21 capsule, Rfl: 0   CALCIUM PO, Take 1 tablet by mouth daily., Disp: , Rfl:    pantoprazole (PROTONIX) 40 MG tablet, Take 1 tablet (40 mg total) by mouth daily., Disp: 30 tablet, Rfl: 1   sucralfate (CARAFATE) 1 G tablet, Take 1 tablet (1 g total) by mouth 4 (four) times daily. (Patient not taking: Reported on 03/23/2015), Disp: 30 tablet, Rfl: 0 No Known Allergies  ROS: A complete ROS was performed with pertinent positives/negatives noted in the HPI. The remainder of the ROS are negative.   Objective:   There were no vitals filed for this visit.  GENERAL: Well-appearing, in NAD. Well nourished.  SKIN: Pink, warm and dry. No  rash, lesion, ulceration, or ecchymoses.  NECK: Trachea midline. Full ROM w/o pain or tenderness. No lymphadenopathy.  RESPIRATORY: Chest wall symmetrical. Respirations even and non-labored. Breath sounds clear to auscultation bilaterally.  CARDIAC: S1, S2 present, regular rate and rhythm. Peripheral pulses 2+ bilaterally.  MSK: Muscle tone and strength appropriate for age. Joints w/o tenderness, redness, or swelling.  EXTREMITIES: Without clubbing, cyanosis, or edema.  NEUROLOGIC: No motor or sensory deficits. Steady, even gait.  PSYCH/MENTAL STATUS: Alert, oriented x 3. Cooperative, appropriate mood and affect.   Health Maintenance Due  Topic Date Due   Medicare Annual Wellness (AWV)  Never done   Pneumonia Vaccine 10+ Years old (1 of 2 - PCV) Never done   Hepatitis C Screening  Never done   DTaP/Tdap/Td (1 - Tdap) Never done   Colonoscopy  Never done   Zoster Vaccines- Shingrix (1 of 2) Never done   INFLUENZA VACCINE  Never done   COVID-19 Vaccine (1 - 2024-25 season) Never done    No results found for any visits on 03/16/24.  Assessment & Plan:  Assessment and Plan               There are no diagnoses linked to this encounter.  No orders of the defined types were placed in this encounter.  No orders of the defined types were placed in this encounter.   No follow-ups on file.   Salvatore Decent, FNP
